# Patient Record
Sex: Male | Born: 1940 | Race: Black or African American | Hispanic: No | Marital: Single | Smoking: Never smoker
Health system: Southern US, Community
[De-identification: ages and names within clinical notes are randomized; demographics above are authoritative.]

## PROBLEM LIST (undated history)

## (undated) DIAGNOSIS — I10 Essential (primary) hypertension: Secondary | ICD-10-CM

## (undated) DIAGNOSIS — E119 Type 2 diabetes mellitus without complications: Secondary | ICD-10-CM

## (undated) DIAGNOSIS — R011 Cardiac murmur, unspecified: Secondary | ICD-10-CM

## (undated) DIAGNOSIS — C61 Malignant neoplasm of prostate: Secondary | ICD-10-CM

## (undated) HISTORY — PX: PROSTATE SURGERY: SHX751

---

## 1998-03-11 ENCOUNTER — Encounter
Admission: RE | Admit: 1998-03-11 | Discharge: 1998-06-09 | Payer: Self-pay | Admitting: Physical Medicine & Rehabilitation

## 1998-07-26 ENCOUNTER — Emergency Department (HOSPITAL_COMMUNITY): Admission: EM | Admit: 1998-07-26 | Discharge: 1998-07-26 | Payer: Self-pay

## 1998-10-04 ENCOUNTER — Encounter: Payer: Self-pay | Admitting: Physical Medicine & Rehabilitation

## 1998-10-04 ENCOUNTER — Ambulatory Visit (HOSPITAL_COMMUNITY)
Admission: RE | Admit: 1998-10-04 | Discharge: 1998-10-04 | Payer: Self-pay | Admitting: Physical Medicine & Rehabilitation

## 1999-10-15 ENCOUNTER — Emergency Department (HOSPITAL_COMMUNITY): Admission: EM | Admit: 1999-10-15 | Discharge: 1999-10-15 | Payer: Self-pay

## 2000-07-17 ENCOUNTER — Inpatient Hospital Stay (HOSPITAL_COMMUNITY): Admission: EM | Admit: 2000-07-17 | Discharge: 2000-07-19 | Payer: Self-pay | Admitting: Emergency Medicine

## 2000-07-17 ENCOUNTER — Encounter: Payer: Self-pay | Admitting: Family Medicine

## 2000-07-18 ENCOUNTER — Encounter: Payer: Self-pay | Admitting: Emergency Medicine

## 2000-12-19 ENCOUNTER — Encounter: Payer: Self-pay | Admitting: Otolaryngology

## 2000-12-19 ENCOUNTER — Encounter: Admission: RE | Admit: 2000-12-19 | Discharge: 2000-12-19 | Payer: Self-pay | Admitting: Otolaryngology

## 2000-12-31 ENCOUNTER — Other Ambulatory Visit: Admission: RE | Admit: 2000-12-31 | Discharge: 2000-12-31 | Payer: Self-pay | Admitting: Otolaryngology

## 2001-02-25 ENCOUNTER — Emergency Department (HOSPITAL_COMMUNITY): Admission: EM | Admit: 2001-02-25 | Discharge: 2001-02-25 | Payer: Self-pay | Admitting: Emergency Medicine

## 2001-07-19 ENCOUNTER — Encounter: Admission: RE | Admit: 2001-07-19 | Discharge: 2001-10-17 | Payer: Self-pay | Admitting: Family Medicine

## 2001-07-30 ENCOUNTER — Encounter: Admission: RE | Admit: 2001-07-30 | Discharge: 2001-07-30 | Payer: Self-pay | Admitting: Otolaryngology

## 2001-07-30 ENCOUNTER — Encounter: Payer: Self-pay | Admitting: Otolaryngology

## 2001-08-30 ENCOUNTER — Other Ambulatory Visit: Admission: RE | Admit: 2001-08-30 | Discharge: 2001-08-30 | Payer: Self-pay | Admitting: Otolaryngology

## 2001-08-30 ENCOUNTER — Encounter (INDEPENDENT_AMBULATORY_CARE_PROVIDER_SITE_OTHER): Payer: Self-pay | Admitting: Specialist

## 2001-12-07 ENCOUNTER — Encounter: Payer: Self-pay | Admitting: Emergency Medicine

## 2001-12-07 ENCOUNTER — Emergency Department (HOSPITAL_COMMUNITY): Admission: EM | Admit: 2001-12-07 | Discharge: 2001-12-07 | Payer: Self-pay | Admitting: Emergency Medicine

## 2003-09-23 ENCOUNTER — Encounter: Payer: Self-pay | Admitting: Emergency Medicine

## 2003-09-23 ENCOUNTER — Inpatient Hospital Stay (HOSPITAL_COMMUNITY): Admission: EM | Admit: 2003-09-23 | Discharge: 2003-09-24 | Payer: Self-pay | Admitting: *Deleted

## 2003-09-24 ENCOUNTER — Encounter: Payer: Self-pay | Admitting: Cardiology

## 2005-05-17 ENCOUNTER — Emergency Department (HOSPITAL_COMMUNITY): Admission: EM | Admit: 2005-05-17 | Discharge: 2005-05-18 | Payer: Self-pay | Admitting: Emergency Medicine

## 2007-02-12 ENCOUNTER — Emergency Department (HOSPITAL_COMMUNITY): Admission: EM | Admit: 2007-02-12 | Discharge: 2007-02-12 | Payer: Self-pay | Admitting: Emergency Medicine

## 2008-04-09 ENCOUNTER — Emergency Department (HOSPITAL_COMMUNITY): Admission: EM | Admit: 2008-04-09 | Discharge: 2008-04-09 | Payer: Self-pay | Admitting: Emergency Medicine

## 2008-05-30 ENCOUNTER — Ambulatory Visit (HOSPITAL_COMMUNITY): Admission: RE | Admit: 2008-05-30 | Discharge: 2008-05-30 | Payer: Self-pay | Admitting: Orthopedic Surgery

## 2008-06-02 ENCOUNTER — Ambulatory Visit (HOSPITAL_BASED_OUTPATIENT_CLINIC_OR_DEPARTMENT_OTHER): Admission: RE | Admit: 2008-06-02 | Discharge: 2008-06-02 | Payer: Self-pay | Admitting: Orthopedic Surgery

## 2008-07-26 ENCOUNTER — Emergency Department (HOSPITAL_COMMUNITY): Admission: EM | Admit: 2008-07-26 | Discharge: 2008-07-26 | Payer: Self-pay | Admitting: Family Medicine

## 2009-12-09 ENCOUNTER — Emergency Department (HOSPITAL_COMMUNITY): Admission: EM | Admit: 2009-12-09 | Discharge: 2009-12-09 | Payer: Self-pay | Admitting: Family Medicine

## 2010-01-05 ENCOUNTER — Emergency Department (HOSPITAL_COMMUNITY): Admission: EM | Admit: 2010-01-05 | Discharge: 2010-01-05 | Payer: Self-pay | Admitting: Emergency Medicine

## 2010-05-02 ENCOUNTER — Observation Stay (HOSPITAL_COMMUNITY): Admission: EM | Admit: 2010-05-02 | Discharge: 2010-05-02 | Payer: Self-pay | Admitting: Emergency Medicine

## 2011-02-27 LAB — URINALYSIS, ROUTINE W REFLEX MICROSCOPIC
Bilirubin Urine: NEGATIVE
Glucose, UA: NEGATIVE mg/dL
Hgb urine dipstick: NEGATIVE
Ketones, ur: NEGATIVE mg/dL
Protein, ur: 30 mg/dL — AB
Urobilinogen, UA: 0.2 mg/dL (ref 0.0–1.0)

## 2011-02-27 LAB — DIFFERENTIAL
Basophils Absolute: 0 10*3/uL (ref 0.0–0.1)
Eosinophils Absolute: 0.2 10*3/uL (ref 0.0–0.7)
Eosinophils Relative: 2 % (ref 0–5)
Lymphocytes Relative: 28 % (ref 12–46)
Lymphs Abs: 2.4 10*3/uL (ref 0.7–4.0)
Neutrophils Relative %: 64 % (ref 43–77)

## 2011-02-27 LAB — POCT I-STAT, CHEM 8
BUN: 13 mg/dL (ref 6–23)
Creatinine, Ser: 1.4 mg/dL (ref 0.4–1.5)
Hemoglobin: 15.6 g/dL (ref 13.0–17.0)
Potassium: 3.5 mEq/L (ref 3.5–5.1)
Sodium: 141 mEq/L (ref 135–145)
TCO2: 27 mmol/L (ref 0–100)

## 2011-02-27 LAB — URINE CULTURE: Colony Count: NO GROWTH

## 2011-02-27 LAB — CBC
HCT: 43.4 % (ref 39.0–52.0)
Platelets: 196 10*3/uL (ref 150–400)
RDW: 14.1 % (ref 11.5–15.5)
WBC: 8.3 10*3/uL (ref 4.0–10.5)

## 2011-03-13 LAB — POCT URINALYSIS DIP (DEVICE)
Bilirubin Urine: NEGATIVE
Glucose, UA: NEGATIVE mg/dL
Ketones, ur: NEGATIVE mg/dL
Nitrite: NEGATIVE

## 2011-04-25 NOTE — Op Note (Signed)
NAMEKEVION, FATHEREE               ACCOUNT NO.:  [id number]   MEDICAL RECORD NO.:  [id number]          PATIENT TYPE:  AMB   LOCATION:  [facility]                         FACILITY:  [facility]   PHYSICIAN:  Deidre Ala, M.D.    DATE OF BIRTH:  06-14-1941   DATE OF PROCEDURE:  06/02/2008  DATE OF DISCHARGE:                               OPERATIVE REPORT   PREOPERATIVE DIAGNOSES:  1. 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  2. Acromioclavicular joint arthritis.  3. Undersurface partial thickness rotator cuff tear by MRI.   POSTOPERATIVE DIAGNOSES:  1. Left shoulder impingement with type 3 acromion.  2. Nonrepairable central rotator cuff tear supraspinatus with      retraction.  3. Slap tear but biceps anchor intact.  4. Osteoarthritis acromioclavicular joint, severe.   PROCEDURE:  1. Left shoulder operative arthroscopy with subacromial arch      decompression and acromioplasty.  2. Extensive debridement of rotator cuff tear nonrepairable and slap      lesion with ablation.  3. Arthroscopic distal clavicle resection.   SURGEON:  Doristine Section, M.D.   ASSISTANT:  Phineas Semen, P.A.-C.   ANESTHESIA:  General with endotracheal with scalene block.   CULTURES:  None.   DRAINS:  None.   BLOOD LOSS:  Minimal.   PATHOLOGY FINDINGS AND HISTORY:  Mr. Mennenga presented to [place] in January  of this year with shoulder pain, sent by Launa Flight, P.A.-C., of [facility].  He had type 3 to 4 acromion with AC  squaring changes and was injected in the subacromial space and the [facility]  joint.  He had impingement signs.  He was first seen on December 16, 2007.  He came back on January 06, 2008, and he had no signs of impingement,  happy with his improvement.  He returned May 29, 2008, and the pain had  returned with a vengeance, and so we sent him for a MRI scan which was  officially read as full width partial thickness bursal surface tearing  of the distal  supraspinatus with retraction of 1-2 cm.  The bursal  surface tearing involves the full width of the tendon and extends into  the superior infraspinatus.  No atrophy of the muscle.  Infraspinatus  showed diffuse tendinopathy with partial thickness bursal surface  tearing.  The teres minor was intact.  The subscapularis showed  insertional tendinopathy.  Long head of the biceps showed tendinopathy  in the rotator interval.  The patient desired to proceed with surgical  intervention at surgery.  We found slap degeneration without frank  detachment.  The biceps tendon had some partial tearing proximally but  overall did not look particularly worn or edematous or injected and  especially when pulled into the joint, so it was left intact.  We just  debrided the slap and used the ablator on 1 to smooth.  There were  fronds of rotator cuff that did not appear normal or well intact to the  tuberosity from underneath.  The glenohumeral surfaces looked good.  The  acromion with sharp hooked with a  prominent CA ligament.  This was all  removed to [facility] margins anterior acromion.  The distal clavicle was  obviously arthritic.  The rotator cuff was pulled centrally off the  tuberosity which was prominent.  I debrided the rotator cuff tear  parameter of the tear about 5 mm because the tissue was not healthy, and  this left me with only about 3-5 mm of intact supraspinatus lateral to  the muscle, and therefore I did not feel he could be pulled through the  footprint and repaired, so I debrided it, smoothing the edges and then  did a tuberosity plasty.  The supraspinatus, infraspinatus was intact.  Good SAD DCR was obtained.   PROCEDURE:  With adequate anesthesia obtained using endotracheal  technique, 1 gram Ancef given IV prophylaxis, the patient was placed in  supine beach-chair position.  Left shoulder was prepped and draped in  the standard fashion.  After standard prepping and draping, skin   markings were made for anatomic positioning.  I then injected the  subacromial space with 20 mL 0.5% Marcaine with epinephrine to open it  up.  I then entered the shoulder through a posterior portal.  Anterior  portal was established just lateral to the coracoid.  I then probed and  debrided with shaver from the front of the slap area and used the  ablator on 1 to smooth.  I pulled the biceps into the joint to check its  consistency and whether not it was inflamed from the groove inward.  I  then reversed portals, and similar shavings were carried out including  the insertion of the rotator cuff as above.  I then entered the  subacromial space in the posterior portal.  Anterolateral portal was  established.  Soft tissue was shaved from the anterior undersurface of  the acromion, and the ablator was used to cauterize.  I then released  the CA ligament with a cautery and used the 6.0 bur to complete  acromioplasty to the roof of the subacromial space in the manner of  Caspari.  A gentle ablation was carried out to smooth the soft tissue  edges.  The the scope was then turned medially sideways, and through the  anterior portal, I debrided the [facility] meniscus and completed the distal  clavicle resection 2 shaver breadths in.  I then evaluated the shoulder  from the lateral portal.  I made an additional posterolateral portal and  pulled the rotator cuff toward me.  It was tight.  The tissues did not  look good quality.  I debrided the rim to what I felt to be healthy, and  it was very avascular and left me with no viable rotator cuff tissue  just lateral to the supraspinatus.  Front and back were then debrided  and smoothed in an arc and ablated so as not to impinge.  I then through  this portal used a bur to complete tuberosity plasty.  Again, ablation  was used to smooth.  The biceps was left intact in its groove.  Good SAD  DCR was seen from the side.  The shoulder was irrigated through the   scope.  Marcaine was not used due to the block.  Portals were closed  with 4-0 nylon.  A bulky sterile compressive dressing was applied with  sling, and the patient having tolerated procedure well was awakened,  taken to recovery room in satisfactory condition, discharged, told to  call the office for recheck tomorrow, given Percocet for pain.  ______________________________  V. Charlesetta Shanks, M.D.     VEP/MEDQ  D:  06/02/2008  T:  06/02/2008  Job:  [id number]   cc:   Deidre Ala, M.D.  Fax: [phone]   Rickard Patience, P.A.  [address]  [place]  [place] [address]

## 2011-04-28 NOTE — Discharge Summary (Signed)
NAMEAYSEN, SHIEH                         ACCOUNT NO.:  [id number]   MEDICAL RECORD NO.:  [id number]                   PATIENT TYPE:  INP   LOCATION:  3728                                 FACILITY:  [facility]   PHYSICIAN:  [name], M.D.                 DATE OF BIRTH:  Apr 25, 1941   DATE OF ADMISSION:  09/23/2003  DATE OF DISCHARGE:  09/24/2003                           DISCHARGE SUMMARY - REFERRING   DISCHARGE DIAGNOSES:  1. Chest pain, resolved.  2. Nonobstructive coronary artery disease.  3. Small perimembranous ventricular septal defect.  4. Hypertension under poor control.  5. Diabetes under poor control.   HOSPITAL COURSE:  Mr. Longest is a 70 year old male patient who has known  coronary artery disease by catheterization two years ago.  At that time, he  had nonobstructive disease in his LAD with a normal right coronary artery  and nonobstructive disease in the circumflex.  There was a question of  ventricular septal defect at that time.  Since that time, he has had poorly  controlled hypertension treated with Catapres and Norvasc.  He has also a  history of Avandia, apparently with poor control.  He has a history of  hyperlipidemia and has recently stopped taking Lipitor.  He presented to his  primary care physician complaining of exertional dyspnea and discomfort at  rest.  He was referred to the emergency room where he was admitted by  [facility].   Lab studies during patient's hospital stay reveal normal cardiac isoenzymes.  The patient's creatinine was transiently elevated at 1.9 was transiently  elevated at 1.9, and he was hydrated.  Potassium remained normal at 3.6, BUN  15.  White count 5.6, hemoglobin 13.5, hematocrit 38.7, platelets 227.   EKG reveals normal sinus rhythm, rate 61, with no acute ST-T wave changes.   Portable chest x-ray revealed nodular opacity at the cardiac apex, and  recommendations were to follow up with a two-view chest x-ray  to exclude an  underlying mass lesion.   The patient then underwent a cardiac catheterization on September 24, 2003.  He was found to have a 50% proximal RCA lesion followed by a 40% LAD lesion.  Diagonal #1 had sequential 30 and 20% lesions.  Circumflex was  angiographically normal.   At this point, Dr. Samule Ohm thought he had moderate nonobstructive coronary  artery disease and suspected noncardiac chest pain.  He felt there was a  small ventricular septal defect, and a 2-D echocardiogram confirmed a small  perimembranous ventricular septal defect with probable normal RV systolic  pressure.  There were no wall motion  abnormalities a normal EF of 55 to 60% .  At this point, we need to make  sure that the patient's chest x-ray is followed up, and I will do this today  prior to discharge home.  If this is normal, will left him proceed with  discharge home.  If not, we can go ahead and arrange outpatient CT scanning.      Guy Franco, P.A. [facility]                      [name], M.D.    LB/MEDQ  D:  09/24/2003  T:  09/24/2003  Job:  [id number]   cc:   [name]. [name], M.D.  [address]  [place]  [place] [address]  Fax: [phone]   [name], M.D.

## 2011-04-28 NOTE — Cardiovascular Report (Signed)
NAMELOGHAN, KURTZMAN                         ACCOUNT NO.:  [id number]   MEDICAL RECORD NO.:  [id number]                   PATIENT TYPE:  INP   LOCATION:  3728                                 FACILITY:  [facility]   PHYSICIAN:  Salvadore Farber, M.D.             DATE OF BIRTH:  28-Oct-1941   DATE OF PROCEDURE:  09/24/2003  DATE OF DISCHARGE:                              CARDIAC CATHETERIZATION   PROCEDURE:  Right and left heart catheterization, coronary angiography.   INDICATION:  Mr. Headen is a 70 year old gentleman with a known congenital  ventricular septal defect and moderate, nonobstructive coronary disease on  catheterization performed August 2001.  He now presents with several weeks  of chest discomfort occurring primarily at rest.  He has had no chest  discomfort with exertion.  He was admitted to hospital and ruled out for  myocardial infarction by serial electrocardiograms and enzymes.  Given his  syndrome concerning for unstable angina in the setting of known coronary  artery disease, he was referred directly for coronary angiography.   PROCEDURAL TECHNIQUE:  Informed consent was obtained.  Under 1% lidocaine  local anesthesia, a 6 French sheath was placed in the right femoral and an 8  French sheath in the right femoral vein using the modified Seldinger  technique.  Right heart catheterization was performed using a balloon-tipped  catheter.  Coronary angiography was performed using JL-4 and JR-4 catheters.  Left heart catheterization was performed using a pigtail catheter.  Left  ventriculography was not performed due to the patient's renal insufficiency  in the setting of diabetes mellitus.  The patient tolerated the procedure  well and was transferred to the holding room in stable condition.  Sheaths  are to be removed there.   COMPLICATIONS:  None.   FINDINGS:   HEMODYNAMICS:  1. RA 7, oxygen saturation 72%; RV 36/6/9, oxygen saturation 73%; PA     36/13/23,  oxygen saturation 72%, PCW 13/13/10; LV 184/3/14, aorta     184/96/133, oxygen saturation 96%, SVC oxygen saturation 70%, IVC oxygen     saturation 65%.  2. Cardiac output/index (Fick) 5.1/2.7, (thermodilution) 8.2/4.4.  3. No aortic stenosis on pullback.  4. No mitral stenosis.  5. QP/QS 1.14.   CORONARY ANGIOGRAPHY:  1. Left main:  Angiographically normal.  2. LAD:  The LAD is a moderate size vessel giving rise to a large first and     small diagonal branch.  The mid LAD has long 40% stenosis arising after     the take off of the first diagonal.  The first diagonal has an ostial 30%     stenosis and a 20% stenosis of the mid vessel.  3. Circumflex:  Luminal irregularities.  4. RCA:  Large, dominant vessel.  There is a focal 50% stenosis of the mid     vessel. The remainder of the vessel was without significant disease.    IMPRESSION/RECOMMENDATIONS:  1. Moderate nonobstructive coronary disease.  Suspect noncardiac etiology to     his chest discomfort.  2. Small ventricular septal defect with QP/QS 1.14.  This is a trivial left-     to-right shunt.                                               Salvadore Farber, M.D.    WED/MEDQ  D:  09/24/2003  T:  09/24/2003  Job:  [id number]   cc:   [name]. [name], M.D.  [address]  [place]  [place] [address]  Fax: [phone]   [name], M.D.

## 2011-04-28 NOTE — H&P (Signed)
Mathew Garcia, Mathew Garcia                         ACCOUNT NO.:  [id number]   MEDICAL RECORD NO.:  [id number]                   PATIENT TYPE:  INP   LOCATION:  1827                                 FACILITY:  [facility]   PHYSICIAN:  Vida Roller, M.D.                DATE OF BIRTH:  [birth year]   DATE OF ADMISSION:  09/23/2003  DATE OF DISCHARGE:                                HISTORY & PHYSICAL   HISTORY OF PRESENT ILLNESS:  Mr. Cicero is a 70 year old African-American  man with known coronary artery disease status post catheterization 2 years  ago for atypical chest discomfort. At that time, he had a perfusion study  which showed evidence of an inferior wall ischemia. He was cathed. Had non-  obstructive disease in his LAD and circumflex with normal right coronary  artery, normal LV function but a question of ventricular septal defect. He  has diabetes mellitus, which has recently been poorly controlled only on  Avandia; hypertension, which has recently been poorly controlled on Catapres  and Norvasc. He has also hyperlipidemia. He has recently stopped taking his  Lipitor. He has a history of VSD. 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 However, it appears that he is followed by Dr.  Tenny Craw but has not been seen by her in a number of years.  He comes in today  complaining of discomfort in his chest over about the last 2 weeks. He  presented to see his primary care physician complaining of exertional  discomfort with some discomfort at rest. He has no shortness of breath or  palpitations. No PND or orthopnea. He was referred to the [facility] emergency room where he was seen.   PAST MEDICAL HISTORY:  Significant for the evaluation for chest discomfort  with normal left ventricular function. He also has diabetes, hypertension  and the history of the VSD. Also has hyperlipidemia.   ALLERGIES:  CODEINE.   SOCIAL HISTORY:  He is married. He lives in [place]. He has 7 children,  all of whom are grown.   FAMILY HISTORY:  Significant for coronary artery disease in his mother.   REVIEW OF SYSTEMS:  Generally noncontributory.   PHYSICAL EXAMINATION:  GENERAL: A well developed, well nourished African-  American man in no apparent distress who is alert and oriented x4.  VITAL SIGNS: Heart rate is 88. Blood pressure is 150/80. Respiratory rate 14  and he is afebrile. He is not complaining of any chest pain.  HEENT: Unremarkable.  NECK: Supple. There is no jugular venous distention. No carotid bruits.  CHEST:  Clear to auscultation.  CARDIAC: Examination reveals a non-displaced point of maximal impulse with  no lifts or thrills. First and second heart sounds are normal. There is a  loud, harsh sounding 3/6 systolic murmur,  heard best at the upper left  sternal border, which does not radiate. There is no diastolic murmur.  ABDOMEN: Soft, nontender, normal active bowel sounds.  EXTREMITIES: Lower extremities have no edema. No cyanosis. He has 2+ pulses  throughout.   LABORATORY DATA:  Electrocardiogram reveals sinus rhythm with marked sinus  arrhythmia. He has left axis deviation with non-specific STT wave changes.   His laboratories are all pending.   Chest x-ray is also pending.   ASSESSMENT:  This is a gentleman with concerning chest discomfort, known  coronary artery disease, multiple cardiac risk facts which have been poorly  controlled. I think it is probably reasonable to cycle his enzymes, admit  him to the hospital, start him on a beta blocker, Lovenox, aspirin, and an  ace inhibitor to control his blood pressure. Will get an echocardiogram  today to assess the severity of his ventricular septal defect and I think  that he probably needs a right and left heart catheterization and we will  put him on the schedule for tomorrow to do that. Will also contact  Dr. Tenny Craw,  to let her know that we are planning to do this for the patient.                                                Vida Roller, M.D.    JH/MEDQ  D:  09/23/2003  T:  09/23/2003  Job:  [id number]   cc:   [name]. [name], M.D.  [address]  [place]  [place] [address]  Fax: [phone]   [name], M.D.

## 2011-04-28 NOTE — Cardiovascular Report (Signed)
[place]. [facility]  Patient:    Mathew Garcia, Mathew Garcia                      MRN: [id number] Proc. Date: 07/19/00 Adm. Date:  [id number] Attending:  [name] CC:         [name]. [name], M.D.  Dietrich Pates, M.D. [facility]   Cardiac Catheterization  PROCEDURE PERFORMED:  Left heart catheterization with coronary angiography, left ventriculography and abdominal aortography.  INDICATION:  Mr. Gosselin is a 70 year old male with a history of a small ventriculoseptal defect.  He was admitted with chest pain and hypertension.  A stress Cardiolite was interpreted as revealing distal inferior ischemia.  He was referred for cardiac catheterization.  PROCEDURAL NOTE:  A 6-French sheath was placed in the right femoral artery. Standard Judkins 6-French catheters were utilized.  Contrast was Omnipaque. At the conclusion of the procedure, a Perclose vascular closure device was placed in the right femoral artery with good hemostasis.  There were no complications.  CATHETERIZATION RESULTS Hemodynamics:  Left ventricular pressure 194/22; aortic pressure 194/104. There was no aortic valve gradient.  Left ventriculogram:  Wall motion is normal.  Ejection fraction calculated at 67%.  No mitral regurgitation.  There is contrast seen in the right ventricle during left ventriculography, consistent with a small ventriculoseptal defect.  Abdominal aortogram:  Abdominal aortogram reveals normal renal arteries, abdominal aorta and iliac arteries.  Coronary arteriography (right dominant): 1. Left main normal. 2. Left anterior descending artery has a 50% stenosis in the mid-vessel just    after a large first diagonal branch.  The first diagonal itself has a 30%    stenosis at its origin.  There is a small second diagonal. 3. Left circumflex has a 40% stenosis at its origin.  The circumflex gives    rise to a large single obtuse marginal branch. 4. Right  coronary artery is a dominant vessel.  It gives rise to a normal-size    posterior descending artery, a small first posterolateral branch and a    large second posterolateral branch.  The right coronary artery is free of    angiographic disease.  IMPRESSION 1. Significant systemic hypertension. 2. Normal left ventricular systolic function with what appears to be a small    ventriculoseptal defect. 3. Nonobstructive coronary artery disease. DD:  07/19/00 TD:  07/19/00 Job: [id number]

## 2011-04-28 NOTE — Discharge Summary (Signed)
[place]. [facility]  Patient:    Mathew Garcia, Mathew Garcia                      MRN: [id number] Adm. Date:  [id number] Disc. Date: 07/19/00 Attending:  Pricilla Garcia Dictator:   Mathew Garcia, P.A.-C. CC:         Mathew Garcia. Mathew Garcia, M.D.   Discharge Summary  DATE OF BIRTH:  1941/03/25  DISCHARGE DIAGNOSES: 1. Nonobstructive coronary artery disease. 2. Uncontrolled hypertension. 3. Family history of coronary artery disease. 4. Small ventricular septal defect.  PROCEDURES PERFORMED: 1. Adenosine Cardiolite revealing mild distal inferior ischemia. 2. Cardiac catheterization on July 19, 2000, revealing normal ventricle    with normal wall motion, ejection fraction calculated at 67%.  No    mitral regurgitation, positive VSD.  Left main normal.  LAD with    midstenosis of 50%.  First diagonal large with 30% stenosis.  Left    circumflex with ostial stenosis of 40%.  RCA normal.  HISTORY OF PRESENT ILLNESS:  This 70 year old African-Americanerican male with a history of chest pain with a negative Cardiolite in our office last year, presented to the emergency room for evaluation of chest pain.  His chest pain occurred while mowing his lawn.  He developed left chest pain that radiated to his left arm described as a sharp shooting fleeting pain.  It was present off and on.  He decided to go to the emergency room.  There he was found to have a blood pressure of 196/103.  His electrocardiogram showed sinus bradycardia with inferolateral T-wave inversions.  He denied any chest pressure, heaviness, shortness of breath, palpitations, dizziness, or presyncope.  He noted a history of a catheterization in his 70s for congestive heart failure in [place], [place].  PHYSICAL EXAMINATION:  GENERAL:  A pleasant 70 year old black male.  VITAL SIGNS:  Blood pressure 196/103, pulse 50, respirations 16.  NECK:  Without jugular venous distention or bruits.  LUNGS:  Clear  with crackles at the bases.  HEART:  A regular rate and rhythm.  A 2/6 systolic murmur at the apex.  PMI normal.  No right ventricular heave.  ABDOMEN:  Soft.  EXTREMITIES:  Without edema.  Initial chest x-ray revealed no acute cardiopulmonary process.  LABORATORY DATA:  Initial labs showed a WBC of 7300, hemoglobin 13, hematocrit 39, platelet count 228,000.  INR 1.1.  Sodium 142, potassium 3.3, chloride 103, glucose 132, BUN 18, creatinine 1.5.  Total bilirubin 0.8, alkaline phosphatase 93, SGOT 17, SGPT 12, total protein 6.7, albumin 3.2.  Cardiac enzymes:  Total CPK #1 of 125, #2 of 114.  CPK-MB #1 of 1.1, #2 of 1.0. Troponin I #1 of 0.03, #2 of less than 0.03.  Lipid profile:  Total cholesterol 176, triglycerides 131, HDL 32, LDL 118.  HOSPITAL COURSE:  The patient was admitted for chest pain and ruled out for a myocardial infarction by enzymes, as noted above.  Norvasc was added for blood pressure control.  It was decided that he should go for an adenosine Cardiolite to rule out ischemia.  The results of this are noted above.  Given the results of mild distal inferior ischemia, it was decided that the patient should go for a cardiac catheterization.  He went for this on July 19, 2000. The results are noted above.  He had no immediate complications.  Given his nonobstructive coronary artery disease, it was felt he was stable enough for discharge  to home.  The positive VSD by cardiac catheterization was noted to be the culprit of his murmur.  DISCHARGE MEDICATIONS: 1. Catapres 0.2 mg b.i.d. 2. Coated aspirin 81 mg q.d. 3. Norvasc 10 mg p.o. q.d.  ACTIVITIES:  No lifting, driving, sexual activity, or exertion for two days.  DIET:  Low-salt diet, low-fat, low-cholesterol diet.  WOUND CARE:  The patient is to watch his groin for bruising, swelling, drainage, or discomfort, and to call our office with concerns.  FOLLOWUP:  With Dr. Elyse Garcia physicians assistant on Monday,  July 30, 2000, at 8:30 a.m.  The patient should follow up with his primary care physician, Dr. Duffy Rhody C. Garcia next week for a blood pressure check.  He should call for this appointment.  ADDENDUM:  The patients potassium was repleted, and it was noted to be 3.5 prior to discharge. DD:  07/19/00 TD:  07/19/00 Job: [id number]

## 2011-09-05 LAB — URINALYSIS, ROUTINE W REFLEX MICROSCOPIC
Leukocytes, UA: NEGATIVE
Nitrite: NEGATIVE
Specific Gravity, Urine: 1.013
Urobilinogen, UA: 1
pH: 6

## 2011-09-05 LAB — URINE MICROSCOPIC-ADD ON

## 2011-09-07 LAB — POCT I-STAT 4, (NA,K, GLUC, HGB,HCT)
HCT: 44
Hemoglobin: 15
Sodium: 141

## 2013-01-31 ENCOUNTER — Emergency Department (HOSPITAL_COMMUNITY): Payer: Medicare PPO

## 2013-01-31 ENCOUNTER — Encounter (HOSPITAL_COMMUNITY): Payer: Self-pay | Admitting: Emergency Medicine

## 2013-01-31 ENCOUNTER — Inpatient Hospital Stay (HOSPITAL_COMMUNITY)
Admission: EM | Admit: 2013-01-31 | Discharge: 2013-02-04 | DRG: 300 | Disposition: A | Discharge status: Home / Self Care | Payer: Medicare PPO | Attending: Internal Medicine | Admitting: Internal Medicine

## 2013-01-31 DIAGNOSIS — E119 Type 2 diabetes mellitus without complications: Secondary | ICD-10-CM | POA: Diagnosis present

## 2013-01-31 DIAGNOSIS — Z7982 Long term (current) use of aspirin: Secondary | ICD-10-CM

## 2013-01-31 DIAGNOSIS — Z8546 Personal history of malignant neoplasm of prostate: Secondary | ICD-10-CM

## 2013-01-31 DIAGNOSIS — R011 Cardiac murmur, unspecified: Secondary | ICD-10-CM

## 2013-01-31 DIAGNOSIS — Z79899 Other long term (current) drug therapy: Secondary | ICD-10-CM

## 2013-01-31 DIAGNOSIS — Q210 Ventricular septal defect: Secondary | ICD-10-CM

## 2013-01-31 DIAGNOSIS — I129 Hypertensive chronic kidney disease with stage 1 through stage 4 chronic kidney disease, or unspecified chronic kidney disease: Secondary | ICD-10-CM | POA: Diagnosis present

## 2013-01-31 DIAGNOSIS — I7774 Dissection of vertebral artery: Secondary | ICD-10-CM

## 2013-01-31 DIAGNOSIS — E785 Hyperlipidemia, unspecified: Secondary | ICD-10-CM | POA: Diagnosis present

## 2013-01-31 DIAGNOSIS — N183 Chronic kidney disease, stage 3 unspecified: Secondary | ICD-10-CM | POA: Diagnosis present

## 2013-01-31 DIAGNOSIS — I10 Essential (primary) hypertension: Secondary | ICD-10-CM | POA: Diagnosis present

## 2013-01-31 DIAGNOSIS — N289 Disorder of kidney and ureter, unspecified: Secondary | ICD-10-CM | POA: Diagnosis present

## 2013-01-31 DIAGNOSIS — E876 Hypokalemia: Secondary | ICD-10-CM | POA: Diagnosis present

## 2013-01-31 DIAGNOSIS — I059 Rheumatic mitral valve disease, unspecified: Secondary | ICD-10-CM

## 2013-01-31 DIAGNOSIS — I2510 Atherosclerotic heart disease of native coronary artery without angina pectoris: Secondary | ICD-10-CM | POA: Diagnosis present

## 2013-01-31 DIAGNOSIS — R918 Other nonspecific abnormal finding of lung field: Secondary | ICD-10-CM | POA: Diagnosis present

## 2013-01-31 HISTORY — DX: Type 2 diabetes mellitus without complications: E11.9

## 2013-01-31 HISTORY — DX: Malignant neoplasm of prostate: C61

## 2013-01-31 HISTORY — DX: Essential (primary) hypertension: I10

## 2013-01-31 HISTORY — DX: Cardiac murmur, unspecified: R01.1

## 2013-01-31 LAB — CBC WITH DIFFERENTIAL/PLATELET
Basophils Relative: 0 % (ref 0–1)
Hemoglobin: 13.9 g/dL (ref 13.0–17.0)
Lymphocytes Relative: 45 % (ref 12–46)
MCHC: 35.9 g/dL (ref 30.0–36.0)
Monocytes Relative: 7 % (ref 3–12)
Neutro Abs: 2.9 10*3/uL (ref 1.7–7.7)
Neutrophils Relative %: 43 % (ref 43–77)
RBC: 4.48 MIL/uL (ref 4.22–5.81)
WBC: 6.8 10*3/uL (ref 4.0–10.5)

## 2013-01-31 LAB — URINALYSIS, ROUTINE W REFLEX MICROSCOPIC
Glucose, UA: 100 mg/dL — AB
Ketones, ur: NEGATIVE mg/dL
Protein, ur: 100 mg/dL — AB

## 2013-01-31 LAB — COMPREHENSIVE METABOLIC PANEL
AST: 24 U/L (ref 0–37)
Albumin: 3.8 g/dL (ref 3.5–5.2)
Alkaline Phosphatase: 122 U/L — ABNORMAL HIGH (ref 39–117)
BUN: 19 mg/dL (ref 6–23)
CO2: 24 mEq/L (ref 19–32)
Chloride: 102 mEq/L (ref 96–112)
GFR calc non Af Amer: 35 mL/min — ABNORMAL LOW (ref >90)
Potassium: 3.3 mEq/L — ABNORMAL LOW (ref 3.5–5.1)
Total Bilirubin: 0.5 mg/dL (ref 0.3–1.2)

## 2013-01-31 LAB — GLUCOSE, CAPILLARY: Glucose-Capillary: 170 mg/dL — ABNORMAL HIGH (ref 70–99)

## 2013-01-31 LAB — HEPARIN LEVEL (UNFRACTIONATED)
Heparin Unfractionated: <0.1 IU/mL — ABNORMAL LOW (ref 0.30–0.70)
Heparin Unfractionated: 0.5 IU/mL (ref 0.30–0.70)

## 2013-01-31 MED ORDER — ONDANSETRON HCL 4 MG PO TABS: 4.0000 mg | ORAL_TABLET | Freq: Four times a day (QID) | ORAL | Status: DC | PRN

## 2013-01-31 MED ORDER — DOCUSATE SODIUM 100 MG PO CAPS
100.0000 mg | ORAL_CAPSULE | Freq: Two times a day (BID) | ORAL | Status: DC
  Administered 2013-01-31 – 2013-02-04 (×6): 100 mg via ORAL
  Filled 2013-01-31 (×10): qty 1

## 2013-01-31 MED ORDER — DEXTROSE-NACL 5-0.9 % IV SOLN
INTRAVENOUS | Status: DC
  Administered 2013-01-31 – 2013-02-01 (×2): via INTRAVENOUS

## 2013-01-31 MED ORDER — LABETALOL HCL 5 MG/ML IV SOLN: 10.0000 mg | INTRAVENOUS | Status: DC | PRN

## 2013-01-31 MED ORDER — IOHEXOL 350 MG/ML SOLN
80.0000 mL | Freq: Once | INTRAVENOUS | Status: AC | PRN
  Administered 2013-01-31: 80 mL via INTRAVENOUS

## 2013-01-31 MED ORDER — HEPARIN (PORCINE) IN NACL 100-0.45 UNIT/ML-% IJ SOLN
900.0000 [IU]/h | INTRAMUSCULAR | Status: DC
  Administered 2013-01-31 – 2013-02-02 (×3): 900 [IU]/h via INTRAVENOUS
  Filled 2013-01-31 (×5): qty 250

## 2013-01-31 MED ORDER — SIMVASTATIN 20 MG PO TABS
20.0000 mg | ORAL_TABLET | Freq: Every evening | ORAL | Status: DC
  Administered 2013-02-01 – 2013-02-03 (×3): 20 mg via ORAL
  Filled 2013-01-31 (×5): qty 1

## 2013-01-31 MED ORDER — HYDRALAZINE HCL 20 MG/ML IJ SOLN
5.0000 mg | INTRAMUSCULAR | Status: DC | PRN
  Administered 2013-01-31: 5 mg via INTRAVENOUS
  Filled 2013-01-31: qty 1

## 2013-01-31 MED ORDER — WARFARIN - PHARMACIST DOSING INPATIENT: Freq: Every day | Status: DC

## 2013-01-31 MED ORDER — SODIUM CHLORIDE 0.9 % IV BOLUS (SEPSIS)
1000.0000 mL | Freq: Once | INTRAVENOUS | Status: AC
  Administered 2013-01-31: 1000 mL via INTRAVENOUS

## 2013-01-31 MED ORDER — ONDANSETRON HCL 4 MG/2ML IJ SOLN
4.0000 mg | Freq: Once | INTRAMUSCULAR | Status: AC
  Administered 2013-01-31: 4 mg via INTRAVENOUS
  Filled 2013-01-31: qty 2

## 2013-01-31 MED ORDER — HYDROMORPHONE HCL PF 1 MG/ML IJ SOLN
1.0000 mg | Freq: Once | INTRAMUSCULAR | Status: AC
  Administered 2013-01-31: 1 mg via INTRAVENOUS
  Filled 2013-01-31: qty 1

## 2013-01-31 MED ORDER — AMLODIPINE BESYLATE 10 MG PO TABS
10.0000 mg | ORAL_TABLET | Freq: Every evening | ORAL | Status: DC
  Administered 2013-01-31 – 2013-02-03 (×4): 10 mg via ORAL
  Filled 2013-01-31 (×7): qty 1

## 2013-01-31 MED ORDER — ONDANSETRON HCL 4 MG/2ML IJ SOLN
4.0000 mg | Freq: Four times a day (QID) | INTRAMUSCULAR | Status: DC | PRN
  Administered 2013-01-31 – 2013-02-01 (×2): 4 mg via INTRAVENOUS
  Filled 2013-01-31 (×2): qty 2

## 2013-01-31 MED ORDER — LABETALOL HCL 5 MG/ML IV SOLN: 5.0000 mg | INTRAVENOUS | Status: DC | PRN

## 2013-01-31 MED ORDER — METOPROLOL TARTRATE 50 MG PO TABS
50.0000 mg | ORAL_TABLET | Freq: Two times a day (BID) | ORAL | Status: DC
  Administered 2013-01-31: 50 mg via ORAL
  Filled 2013-01-31 (×2): qty 1

## 2013-01-31 MED ORDER — METOPROLOL TARTRATE 100 MG PO TABS
100.0000 mg | ORAL_TABLET | Freq: Two times a day (BID) | ORAL | Status: DC
  Administered 2013-01-31 – 2013-02-02 (×2): 100 mg via ORAL
  Filled 2013-01-31 (×5): qty 1

## 2013-01-31 MED ORDER — HYDROMORPHONE HCL PF 1 MG/ML IJ SOLN
1.0000 mg | INTRAMUSCULAR | Status: DC | PRN
  Administered 2013-01-31: 1 mg via INTRAVENOUS
  Administered 2013-01-31: 0.5 mg via INTRAVENOUS
  Administered 2013-02-01: 1 mg via INTRAVENOUS
  Filled 2013-01-31 (×3): qty 1

## 2013-01-31 MED ORDER — WARFARIN SODIUM 5 MG PO TABS: 5.0000 mg | ORAL_TABLET | Freq: Every day | ORAL | Status: DC

## 2013-01-31 MED ORDER — SODIUM CHLORIDE 0.9 % IJ SOLN
3.0000 mL | Freq: Two times a day (BID) | INTRAMUSCULAR | Status: DC
  Administered 2013-01-31 – 2013-02-04 (×6): 3 mL via INTRAVENOUS

## 2013-01-31 MED ORDER — ASPIRIN EC 81 MG PO TBEC
81.0000 mg | DELAYED_RELEASE_TABLET | Freq: Every morning | ORAL | Status: DC
  Administered 2013-01-31 – 2013-02-03 (×4): 81 mg via ORAL
  Filled 2013-01-31 (×6): qty 1

## 2013-01-31 MED ORDER — HYDROMORPHONE HCL PF 1 MG/ML IJ SOLN
0.5000 mg | Freq: Once | INTRAMUSCULAR | Status: AC
  Administered 2013-01-31: 0.5 mg via INTRAVENOUS
  Filled 2013-01-31: qty 1

## 2013-01-31 MED ORDER — INSULIN ASPART 100 UNIT/ML ~~LOC~~ SOLN
0.0000 [IU] | SUBCUTANEOUS | Status: DC
  Administered 2013-01-31 – 2013-02-01 (×4): 3 [IU] via SUBCUTANEOUS
  Administered 2013-02-01 – 2013-02-02 (×2): 2 [IU] via SUBCUTANEOUS
  Filled 2013-01-31: qty 1

## 2013-01-31 MED ORDER — GLIPIZIDE 10 MG PO TABS
10.0000 mg | ORAL_TABLET | Freq: Two times a day (BID) | ORAL | Status: DC
  Administered 2013-01-31 – 2013-02-04 (×9): 10 mg via ORAL
  Filled 2013-01-31 (×14): qty 1

## 2013-01-31 MED ORDER — WARFARIN SODIUM 7.5 MG PO TABS
7.5000 mg | ORAL_TABLET | Freq: Once | ORAL | Status: AC
  Administered 2013-01-31: 7.5 mg via ORAL
  Filled 2013-01-31: qty 1

## 2013-01-31 NOTE — Progress Notes (Signed)
  Echocardiogram 2D Echocardiogram has been performed.  Mathew Garcia 01/31/2013, 11:03 AM

## 2013-01-31 NOTE — ED Notes (Signed)
PA at bedside.

## 2013-01-31 NOTE — ED Notes (Signed)
[id number]<AV> Expected date:<BR> Expected time:<BR> Means of arrival:<BR> Comments:<BR> EMS/hypertension and CBG 166-c/o head and neck pain/73 yo male

## 2013-01-31 NOTE — Consult Note (Signed)
[facility] Consult Note  Reason for Consult: right vertebral artery dissection Referring Physician: Dr. Rulon Abide  CC: Right neck pain  HPI: Mathew Garcia is an 72 y.o. male with a PMH significant for diabetes, hypertension, and prostate cancer s/p radical prostatectomy presented to the [facility] complaining of severe right neck pain.  The pain was a sharp throbbing pain that ran from the base of the neck to the right ear.  He states that it was present when he woke up 2/20 AM and that he was unable to move his head because of the pain.  He denies any slurred speech, facial droop, weakness, numbness, tingling, double vision, loss of vision, or unsteadiness on his feet.  He does note that today his blood pressure was elevated despite the fact that he took his usual blood pressure medications.  CT scan in the ED showed a short segment thrombosis of the right vertebral artery with reconstituted flow.    Past Medical History  Diagnosis Date  . Diabetes mellitus without complication   . Heart murmur   . Hypertension   . Prostate cancer    Past Surgical History  Procedure Laterality Date  . Prostate surgery     Family History  Problem Relation Age of Onset  . Diabetes Mother   . Hypertension Mother    Social History:  reports that he has never smoked. He has never used smokeless tobacco. He reports that he does not drink alcohol or use illicit drugs.  No Known Allergies  Medications: I have reviewed the patient's current medications. Medications Prior to Admission  Medication Sig Dispense Refill  . amLODipine (NORVASC) 10 MG tablet Take 10 mg by mouth every evening.      Marland Kitchen aspirin EC 81 MG tablet Take 81 mg by mouth every morning.      Marland Kitchen glipiZIDE (GLUCOTROL) 10 MG tablet Take 10 mg by mouth 2 (two) times daily before a meal.      . metoprolol (LOPRESSOR) 50 MG tablet Take 50 mg by mouth 2 (two) times daily.      . simvastatin (ZOCOR) 20 MG tablet Take 20 mg by mouth every evening.        ROS: Constitutional: Denies fever, chills, diaphoresis, appetite change and fatigue.  HEENT: Positive for neck pain.  Denies photophobia, eye pain, redness, hearing loss, ear pain, congestion, sore throat, rhinorrhea, sneezing, mouth sores, trouble swallowing, neck stiffness and tinnitus.   Respiratory: Denies SOB, DOE, cough, chest tightness,  and wheezing.   Cardiovascular: Denies chest pain, palpitations and leg swelling.  Gastrointestinal: Denies nausea, vomiting, abdominal pain, diarrhea, constipation, blood in stool and abdominal distention.  Genitourinary: Denies dysuria, urgency, frequency, hematuria, flank pain and difficulty urinating.  Musculoskeletal: Denies myalgias, back pain, joint swelling, arthralgias and gait problem.  Skin: Denies pallor, rash and wound.  Neurological: Denies dizziness, seizures, syncope, weakness, light-headedness, numbness and headaches.  Hematological: Denies adenopathy. Easy bruising, personal or family bleeding history  Psychiatric/Behavioral: Denies suicidal ideation, mood changes, confusion, nervousness, sleep disturbance and agitation  Physical Examination: Blood pressure 187/90, pulse 86, temperature 98.8 F (37.1 C), temperature source Oral, resp. rate 13, height 5\' 5"  (1.651 m), weight 181 lb (82.1 kg), SpO2 98.00%. Constitutional: Vital signs reviewed.  Patient is a well-developed and well-nourished man in no acute distress and cooperative with exam. Alert and oriented x3.  Head: Normocephalic and atraumatic Ear: TM normal bilaterally Mouth: no erythema or exudates, MMM Eyes: PERRL, EOMI, conjunctivae normal, No scleral icterus.  Neck:  Supple, Trachea midline normal ROM, No JVD, mass, thyromegaly, or carotid bruit present.  Cardiovascular: RRR, occasional PVC noted on the monitor. 2/6 systolic murmur best heard in the left lower sternal border, mildly accented with valsalva maneuver. S1 normal, S2 normal, no Rubs or gallops, pulses  symmetric and intact bilaterally Pulmonary/Chest: CTAB, no wheezes, rales, or rhonchi Abdominal: Soft. Non-tender, non-distended, bowel sounds are normal, no masses, organomegaly, or guarding present.  GU: no CVA tenderness Musculoskeletal: No joint deformities, erythema, or stiffness, ROM full and no nontender Hematology: no cervical, inginal, or axillary adenopathy.  Skin: Warm, dry and intact. No rash, cyanosis, or clubbing.  Psychiatric: Normal mood and affect. speech and behavior is normal. Judgment and thought content normal. Cognition and memory are normal.   Neurologic Examination Mental Status: Alert, oriented, thought content appropriate.  Speech fluent without evidence of aphasia.  Able to follow 3 step commands without difficulty. Cranial Nerves: II: visual fields grossly normal, pupils equal, round, reactive to light and accommodation III,IV, VI: ptosis not present, extra-ocular motions intact bilaterally V,VII: smile symmetric, facial light touch sensation normal bilaterally VIII: hearing normal bilaterally IX,X: gag reflex present XI: trapezius strength/neck flexion strength normal bilaterally XII: tongue strength normal  Motor: Right : Upper extremity    Left:     Upper extremity 5/5 deltoid       5/5 deltoid 5/5 biceps      5/5 biceps  5/5 triceps      5/5 triceps 5/5wrist flexion     5/5 wrist flexion 5/5 wrist extension     5/5 wrist extension 5/5 hand grip      5/5 hand grip  Lower extremity     Lower extremity 5/5 hip flexor      5/5 hip flexor 5/5 hip adductors     5/5 hip adductors 5/5 hip abductors     5/5 hip abductors 5/5 quadricep      5/5 quadriceps  5/5 hamstrings     5/5 hamstrings 5/5 plantar flexion       5/5 plantar flexion 5/5 plantar extension     5/5 plantar extension Tone and bulk:normal tone throughout; no atrophy noted Sensory: Pinprick and light touch intact throughout, bilaterally Deep Tendon Reflexes: 2+ and symmetric in the  brachioradialis, patellar, and ankle.  Plantars: Right: downgoing   Left: downgoing Cerebellar: normal finger-to-nose, normal rapid alternating movements and normal heel-to-shin test Gait not tested.   Laboratory Studies:   Basic Metabolic Panel:  Recent Labs Lab 01/31/13 0130  NA 140  K 3.3*  CL 102  CO2 24  GLUCOSE 205*  BUN 19  CREATININE 1.88*  CALCIUM 9.0   Liver Function Tests:  Recent Labs Lab 01/31/13 0130  AST 24  ALT 15  ALKPHOS 122*  BILITOT 0.5  PROT 8.6*  ALBUMIN 3.8   CBC:  Recent Labs Lab 01/31/13 0130  WBC 6.8  NEUTROABS 2.9  HGB 13.9  HCT 38.7*  MCV 86.4  PLT 208   CBG:  Recent Labs Lab 01/31/13 0557  GLUCAP 170*    Microbiology: Results for orders placed during the hospital encounter of 05/02/10  URINE CULTURE     Status: None   Collection Time    05/02/10 12:56 PM      Result Value Range Status   Specimen Description URINE, CLEAN CATCH   Final   Special Requests NONE   Final   Colony Count NO GROWTH   Final   Culture NO GROWTH   Final  Report Status 05/03/2010 FINAL   Final   Urinalysis:  Recent Labs Lab 01/31/13 0320  COLORURINE YELLOW  LABSPEC 1.013  PHURINE 6.5  GLUCOSEU 100*  HGBUR MODERATE*  BILIRUBINUR NEGATIVE  KETONESUR NEGATIVE  PROTEINUR 100*  UROBILINOGEN 0.2  NITRITE NEGATIVE  LEUKOCYTESUR NEGATIVE   Other results: EKG: normal EKG, normal sinus rhythm, there are no previous tracings available for comparison.  Imaging: Ct Angio Neck W/cm &/or Wo/cm  01/31/2013  *RADIOLOGY REPORT*  Clinical Data:  72 year old male with neck pain times 1 day and elevated blood pressure.  History of diabetes.  History of prostate cancer.  CT ANGIOGRAPHY NECK  Technique:  Multidetector CT imaging of the neck was performed using the standard protocol during bolus administration of intravenous contrast.  Multiplanar CT image reconstructions including MIPs were obtained to evaluate the vascular anatomy. Carotid stenosis  measurements (when applicable) are obtained utilizing NASCET criteria, using the distal internal carotid diameter as the denominator.  Contrast:  80 ml Omnipaque-300.  Comparison:   None.  Findings:  Mild dependent atelectasis in the lung apices. In addition, there is suggestion of numerous pulmonary nodules, miliary pattern.  No superior mediastinal lymphadenopathy.  Negative thyroid, larynx, pharynx, parapharyngeal spaces, sublingual space, submandibular glands and parotid glands.  Cervical lymph  Cervical limits are at the upper limits of normal for size and number.  Previous paranasal sinus surgery.  Polypoid opacity partially visible in the nasal cavity.  Other visualized paranasal sinuses and mastoids are clear.  No acute osseous abnormality identified. Asymmetric left stylohyoid ligament calcification.  Negative visualized brain parenchyma and orbital apex soft tissues.  Vascular Findings: Mild to moderate aortic arch atherosclerosis, but mostly in the distal arch and proximal descending segment.  For vessel arch configuration, the left vertebral artery arises directly from the arch.  No great vessel origin stenosis.  Normal right CCA origin. Tortuous right common carotid artery.  Right carotid bifurcation widely patent.  Tortuous but otherwise negative cervical right ICA. Minimal right ICA siphon atherosclerosis.  Right ICA terminus, right MCA and right ACA origins are patent. There is a right posterior communicating artery which is patent.  No proximal right subclavian artery stenosis.  The right vertebral artery origin is patent, however 7 mm beyond its origin there is thrombosis of the vessel which is distended with clot (series 5 image 84, coronal image 88).  The vessel is thrombosed over a 16 mm segment, but is reconstituted from mild spinal artery collateral at the proximal V2 segment (C6 level).  Subsequently the right vertebral artery is patent throughout the V2 segment and to the skull base.  There  is mild V4 segment calcified atherosclerosis without stenosis.  The vertebrobasilar junction is patent.  Right PICA, AICA, and SCA origins are patent.  Left CCA is patent with mild to moderate tortuosity.  Left carotid bifurcation is widely patent.  Cervical left ICA is normal aside from tortuosity.  Left ICA siphon is patent with mild atherosclerosis. Left ICA terminus, left MCA and ACA origins are patent.  Left vertebral artery origin off the aortic arch is patent.  Left proximal V2 segment is tortuous.  Left vertebral artery is patent throughout the neck without stenosis.  Left PICA is normal.  No distal left vertebral artery stenosis.  No basilar stenosis.  Left PCA origin is patent.  Fetal type right PCA suspected.   Review of the MIP images confirms the above findings.  IMPRESSION: 1.  Short segment thrombosis of the right vertebral artery just beyond  its origin; 16 mm of the distal right V1 segment is thrombosed and distended with clot.  However, flow is reconstituted at the C6 vertebral level via the spinal artery collateral, and the more distal right vertebral artery is patent without stenosis. 2.  Otherwise negative neck CTA.  Mild atherosclerosis.  Incidental left vertebral artery origin off the aortic arch. 3.  Miliary pattern of tiny pulmonary nodules in the visualized lung apices. Main differential considerations include infection including atypical agents (TB, varicella, fungal) and metastatic disease.  A preliminary report without discrepancy to the above was issued by Dr. Jearld Lesch to Dr. Drue Novel at 0422 hours on 01/31/2013.   Original Report Authenticated By: Erskine Speed, M.D.    Assessment/Plan: Mathew Garcia is a 72 year old man with the PMH listed above who presented with the severe right neck pain and found to have a right vertebral artery dissection with clot.    1.  Right vertebral artery dissection with occlusion: Seen on the CT scan.  Occlusion starts just distal to the origin of the  right vertebral artery and extends about 1.6 cm distally.  This appears to be acute.  He has risk factors for vasculopathy with his diabetes, HTN, and HLD.  Currently he has no demonstrated neurologic defects on exam but is at high risk of both extension of the dissection as well as embolism.    - Q2 neuro checks for 24 hours  - Continue heparin per pharmacy.  Will need continued anti-coagulation for 3-6 months.   - Continue daily ASA therapy  - Restart home blood pressure medications as well as PRN labetalol to keep SBP <180.  - Risk stratification for stroke with HgBA1C, FLP  Leodis Sias, MD Internal Medicine Resident, PGY III Co-Chief Resident, Internal Medicine Pager: [phone] 01/31/2013 9:20 AM   I have seen and evaluated the patient. I have reviewed the above note and agree with the contents.    Denies smoking, + FH of DM  Neck pain that started acutely, no other associated symptoms.   Awake, alert, some problem with year.  PERRL, EOMI, VFF Strength full Sensation intact DTR 2+ and symmetric  R Vert dissection Anticoagulation, BP control for SBP < 180 Frequent neuro checks, if develops symptoms, could consider intervention.    Ritta Slot, MD [facility] [phone]  If 7pm- 7am, please page neurology on call at [phone].

## 2013-01-31 NOTE — ED Notes (Signed)
Pt back from CT

## 2013-01-31 NOTE — Progress Notes (Signed)
ANTICOAGULATION CONSULT NOTE - Follow Up Consult  Pharmacy Consult for Heparin and Coumadin Indication: Vertebral artery dissection  No Known Allergies  Labs:  Recent Labs  01/31/13 0130 01/31/13 0555 01/31/13 1515  HGB 13.9  --   --   HCT 38.7*  --   --   PLT 208  --   --   HEPARINUNFRC  --  <0.10* 0.50  CREATININE 1.88*  --   --     Estimated Creatinine Clearance: 36 ml/min (by C-G formula based on Cr of 1.88).  Marland Kitchen dextrose 5 % and 0.9% NaCl 50 mL/hr at 01/31/13 1700  . heparin 900 Units/hr (01/31/13 1700)     Assessment: 72 year old male with vertebral artery dissection.  Heparin level therapeutic at 0.50.  Consult received to start Coumadin this evening.  Planning continued anticoagulation for 3 to 6 months  Goal of Therapy:  INR 2-3 Heparin level 0.3-0.5 units/ml Monitor platelets by anticoagulation protocol: Yes   Plan:  1) Continue heparin at 900 units / hr 2) Follow up AM heparin level 3) Coumadin 7.5mg  today 4) Daily PT/INR monitoring 5) Will order Coumadin education materials once out of ICU  Estella Husk, Pharm.D., BCPS Clinical Pharmacist  Phone [phone] Pager [phone] 01/31/2013, 7:05 PM

## 2013-01-31 NOTE — Progress Notes (Signed)
[facility] [place] TEAM 1 - Stepdown/ICU TEAM  This patient was admitted this morning by Dr. Conley Rolls.  He has been seen for a followup visit.  I have spoken with his family at the bedside.  [facility] Team 1 has assumed the attending role.  Lonia Blood, MD [facility] Office  [phone] Pager [phone]  On-Call/Text Page:      [email or web address]      password [facility]

## 2013-01-31 NOTE — ED Notes (Signed)
Per [facility], pt. Is from home with  Complaint of right neck pain  as he got up today at 9am at the scale of 10/10. Pt. Was working with car whole day and pain got worst. Pt. Also has BP of 200/156mmhg. Denies dizziness, nor headache. Denies weakness on any extremities.

## 2013-01-31 NOTE — Progress Notes (Signed)
UR completed 

## 2013-01-31 NOTE — Progress Notes (Signed)
ANTICOAGULATION CONSULT NOTE - Follow Up Consult  Pharmacy Consult for Heparin Indication: Vertebral artery dissection  No Known Allergies  Labs:  Recent Labs  01/31/13 0130 01/31/13 0555 01/31/13 1515  HGB 13.9  --   --   HCT 38.7*  --   --   PLT 208  --   --   HEPARINUNFRC  --  <0.10* 0.50  CREATININE 1.88*  --   --     Estimated Creatinine Clearance: 36 ml/min (by C-G formula based on Cr of 1.88).  Assessment: 72 year old male with vertebral artery dissection.  Heparin level therapeutic at 0.50  Planning continued anticoagulation for 3 to 6 months  Goal of Therapy:  Heparin level 0.3-0.5 units/ml Monitor platelets by anticoagulation protocol: Yes   Plan:  1) Continue heparin at 900 units / hr 2) Follow up AM heparin level  Thank you. Okey Regal, PharmD [phone]  01/31/2013,3:49 PM

## 2013-01-31 NOTE — ED Provider Notes (Signed)
History     CSN: [id number]  Arrival date & time 01/31/13  0119   First MD Initiated Contact with Patient 01/31/13 0138      Chief Complaint  Patient presents with  . Neck Pain  . Hypertension    (Consider location/radiation/quality/duration/timing/severity/associated sxs/prior treatment) HPI Comments: Mathew Garcia is a 72 y.o. male with a history of diabetes, prostate cancer, hypertension and heart murmur presents emergency Department with a chief complaint of right-sided neck pain.  Patient reports that he woke up Thursday morning with severe 10/10 pain which has not been relieved by anything.  In addition he reports that his blood pressure has been elevated higher than usual and refractory to his medication Lopressor and amlodipine.  Patient reports medication compliance.  In addition patient denies any headaches, dizziness, nausea, vomiting, weakness of extremities, decreased sensation, syncope, change in vision or any recent trauma.  Note the patient does report prior to the neck pain patient was working on his car for long hours.  Patient has not experienced pain like this before.   The history is provided by the patient.    Past Medical History  Diagnosis Date  . Diabetes mellitus without complication   . Heart murmur   . Hypertension   . Prostate cancer     Past Surgical History  Procedure Laterality Date  . Prostate surgery      No family history on file.  History  Substance Use Topics  . Smoking status: Not on file  . Smokeless tobacco: Not on file  . Alcohol Use: Not on file      Review of Systems  Constitutional: Negative for fever, chills and appetite change.  HENT: Positive for neck pain. Negative for congestion and facial swelling.   Eyes: Negative for visual disturbance.  Respiratory: Negative for shortness of breath.   Cardiovascular: Negative for chest pain and leg swelling.  Gastrointestinal: Negative for abdominal pain.  Genitourinary:  Negative for dysuria, urgency and frequency.  Neurological: Negative for dizziness, syncope, weakness, light-headedness, numbness and headaches.  Psychiatric/Behavioral: Negative for confusion.  All other systems reviewed and are negative.    Allergies  Review of patient's allergies indicates no known allergies.  Home Medications   Current Outpatient Rx  Name  Route  Sig  Dispense  Refill  . amLODipine (NORVASC) 10 MG tablet   Oral   Take 10 mg by mouth every evening.         Marland Kitchen aspirin EC 81 MG tablet   Oral   Take 81 mg by mouth every morning.         Marland Kitchen glipiZIDE (GLUCOTROL) 10 MG tablet   Oral   Take 10 mg by mouth 2 (two) times daily before a meal.         . metoprolol (LOPRESSOR) 50 MG tablet   Oral   Take 50 mg by mouth 2 (two) times daily.         . simvastatin (ZOCOR) 20 MG tablet   Oral   Take 20 mg by mouth every evening.           BP 192/79  Pulse 73  Temp(Src) 98.6 F (37 C) (Oral)  SpO2 100%  Physical Exam  Nursing note and vitals reviewed. Constitutional: He appears well-developed and well-nourished. No distress.  HENT:  Head: Normocephalic and atraumatic.  Eyes: Conjunctivae and EOM are normal.  Neck: Normal range of motion. Neck supple.    No cervical bony tenderness. Decreased ROM d/t pain.  ttp over vertebral artery, no carotid bruits  Cardiovascular:  Intact distal pulses, capillary refill < 3 seconds  Abdominal:  Non pulsatile aorta   Musculoskeletal:  RUE: no pain w ROM, no muscular ttp. All other extremities with normal ROM  Neurological:  CN II-XII intact. Good coordination. No sensory deficit  Skin: He is not diaphoretic.  Skin intact    ED Course  Procedures (including critical care time)   Date: 01/31/2013  Rate: 71  Rhythm: normal sinus rhythm  QRS Axis: left  Intervals: normal  ST/T Wave abnormalities: normal  Conduction Disutrbances:left anterior fascicular block  Narrative Interpretation:   Old EKG  Reviewed: none available  Labs Reviewed  CBC WITH DIFFERENTIAL - Abnormal; Notable for the following:    HCT 38.7 (*)    All other components within normal limits  COMPREHENSIVE METABOLIC PANEL - Abnormal; Notable for the following:    Potassium 3.3 (*)    Glucose, Bld 205 (*)    Creatinine, Ser 1.88 (*)    Total Protein 8.6 (*)    Alkaline Phosphatase 122 (*)    GFR calc non Af Amer 35 (*)    GFR calc Af Amer 40 (*)    All other components within normal limits  URINALYSIS, ROUTINE W REFLEX MICROSCOPIC   No results found. CRITICAL CARE Performed by: Jaci Carrel   Total critical care time: 30  Critical care time was exclusive of separately billable procedures and treating other patients.  Critical care was necessary to treat or prevent imminent or life-threatening deterioration.  Critical care was time spent personally by me on the following activities: development of treatment plan with patient and/or surrogate as well as nursing, discussions with consultants, evaluation of patient's response to treatment, examination of patient, obtaining history from patient or surrogate, ordering and performing treatments and interventions, ordering and review of laboratory studies, ordering and review of radiographic studies, pulse oximetry and re-evaluation of patient's condition.   No diagnosis found.  CT Angio neck: (official imaging to be read by neuro specialist in the morning) #1- Just distal to the R vertebral artery origin there is complete occlusion, multifocal luminal narrowing as it extends cranially c/w steno-occlusive dissection.  #2- Miliary lung nodules, cannot r/o mets. Pt w hx of prostate CA. Imaging not consistant   Consult Neuro Hospitalist: Dr. Roseanne Reno- requests pt to be admitted at [facility]. There is no need to aggressively treat BP. Pt to be started on Heparin, No bolus and low dose.    MDM  Vertebral artery dissection  72 year old male with a history of  hypertension and hyperlipidemia presents emergency department with a chief complaint of acute onset of right neck pain.  Onset of symptoms began approximately 24 hours ago.  Patient denied all associated symptoms including dizziness, nausea, vomiting, ataxia, disequilibrium, headaches or vision changes.  BP 183/95  Pulse 73  Temp(Src) 98.6 F (37 C) (Oral)  Resp 15  SpO2 97% physical exam showed tenderness to palpation only over vertebral artery without any para muscular soreness.  Concern for dissection indicated CT angios neck which showed a vertebral artery dissection.  Pharmacy has been consult is to heparinize patient.  Neuro hospitalist consult as above.  Patient is to be admitted by [facility] and [facility] to [facility].  Case discussed with attending who agrees with treatment plan.           Jaci Carrel, [place] 01/31/13 0501

## 2013-01-31 NOTE — ED Notes (Signed)
Pt transported to CT ?

## 2013-01-31 NOTE — Progress Notes (Signed)
Mathew Garcia arrived to our unit about 0800. He is alert, MAE equally, VSS, heparin infusing at 9cc/hr. Christopher with neurology and Dr Sharon Seller notified of pts arrival.

## 2013-01-31 NOTE — H&P (Signed)
[facility] History and Physical  Mathew Garcia ZOX:[id number] DOB: November 15, 1941    PCP:   No primary provider on file.   Chief Complaint: right neck pain in a pulsatile manner.   HPI: Mathew Garcia is an 72 y.o. male with  history of diabetes, prostate cancer, hypertension and heart murmur, presents emergency Department complaining of severe right-sided neck pain. Patient reports that he woke up Thursday morning with severe pain of his right neck extending to his right ear.  He also noted that his BP was quite elevated despite taking Lopressor and amlodipine.  He really had no significant headaches, dizziness, nausea, vomiting, weakness of extremities, decreased sensation, syncope, change in vision or any recent trauma.  Work up in the ER showed a neck CTA  showed  (official imaging to be read by neuro specialist in the morning)  #1- Just distal to the R vertebral artery origin there is complete occlusion, multifocal luminal narrowing as it extends cranially c/w steno-occlusive dissection.  #2- Miliary lung nodules, cannot r/o mets. Pt w hx of prostate CA. Imaging not consistant.  EDP consulted neurologist who recommended IV Heparin without bolus and to transfer to [facility] under [facility] service.  Rewiew of Systems:  Constitutional: Negative for malaise, fever and chills. No significant weight loss or weight gain Eyes: Negative for eye pain, redness and discharge, diplopia, visual changes, or flashes of light. ENMT: Negative for ear pain, hoarseness, nasal congestion, sinus pressure and sore throat. No headaches; tinnitus, drooling, or problem swallowing. Cardiovascular: Negative for chest pain, palpitations, diaphoresis, dyspnea and peripheral edema. ; No orthopnea, PND Respiratory: Negative for cough, hemoptysis, wheezing and stridor. No pleuritic chestpain. Gastrointestinal: Negative for nausea, vomiting, diarrhea, constipation, abdominal pain, melena, blood in stool, hematemesis, jaundice and  rectal bleeding.    Genitourinary: Negative for frequency, dysuria, incontinence,flank pain and hematuria; Musculoskeletal: Negative for back pain and neck pain. Negative for swelling and trauma.;  Skin: . Negative for pruritus, rash, abrasions, bruising and skin lesion.; ulcerations Neuro: Negative for headache, lightheadedness and neck stiffness. Negative for weakness, altered level of consciousness , altered mental status, extremity weakness, burning feet, involuntary movement, seizure and syncope.  Psych: negative for anxiety, depression, insomnia, tearfulness, panic attacks, hallucinations, paranoia, suicidal or homicidal ideation    Past Medical History  Diagnosis Date  . Diabetes mellitus without complication   . Heart murmur   . Hypertension   . Prostate cancer     Past Surgical History  Procedure Laterality Date  . Prostate surgery      Medications:  HOME MEDS: Prior to Admission medications   Medication Sig Start Date End Date Taking? Authorizing Provider  amLODipine (NORVASC) 10 MG tablet Take 10 mg by mouth every evening.   Yes Historical Provider, MD  aspirin EC 81 MG tablet Take 81 mg by mouth every morning.   Yes Historical Provider, MD  glipiZIDE (GLUCOTROL) 10 MG tablet Take 10 mg by mouth 2 (two) times daily before a meal.   Yes Historical Provider, MD  metoprolol (LOPRESSOR) 50 MG tablet Take 50 mg by mouth 2 (two) times daily.   Yes Historical Provider, MD  simvastatin (ZOCOR) 20 MG tablet Take 20 mg by mouth every evening.   Yes Historical Provider, MD     Allergies:  No Known Allergies  Social History: He is retired.  He lives at home with his daughter.  He denied tobacco, alcohol or drug use.  Family History:  No premature CAD disease.  Non contributory.  Physical Exam: Filed Vitals:   01/31/13 0122 01/31/13 0430 01/31/13 0506  BP: 192/79 183/95   Pulse: 73 73   Temp: 98.6 F (37 C)    TempSrc: Oral    Resp:  15   Height:   5\' 5"  (1.651 m)   Weight:   83.915 kg (185 lb)  SpO2: 100% 97%    Blood pressure 183/95, pulse 73, temperature 98.6 F (37 C), temperature source Oral, resp. rate 15, height 5\' 5"  (1.651 m), weight 83.915 kg (185 lb), SpO2 97.00%.  GEN:  Pleasant patient lying in the stretcher in no acute distress; cooperative with exam. PSYCH:  alert and oriented x4; does not appear anxious or depressed; affect is appropriate. HEENT: Mucous membranes pink and anicteric; PERRLA; EOM intact; no cervical lymphadenopathy nor thyromegaly or carotid bruit; no JVD; There were no stridor. Neck is very supple.  Tenderness over the right posterior ear. Breasts:: Not examined CHEST WALL: No tenderness CHEST: Normal respiration, clear to auscultation bilaterally.  HEART: Regular rate and rhythm.  There is a 2/6 SEM with no rub, or gallops.   BACK: No kyphosis or scoliosis; no CVA tenderness ABDOMEN: soft and non-tender; no masses, no organomegaly, normal abdominal bowel sounds; no pannus; no intertriginous candida. There is no rebound and no distention. Rectal Exam: Not done EXTREMITIES: No bone or joint deformity; age-appropriate arthropathy of the hands and knees; no edema; no ulcerations.  There is no calf tenderness. Genitalia: not examined PULSES: 2+ and symmetric SKIN: Normal hydration no rash or ulceration CNS: Cranial nerves 2-12 grossly intact no focal lateralizing neurologic deficit.  Speech is fluent; uvula elevated with phonation, facial symmetry and tongue midline. DTR are normal bilaterally, cerebella exam is intact, barbinski is negative and strengths are equaled bilaterally.  No sensory loss.   Labs on Admission:  Basic Metabolic Panel:  Recent Labs Lab 01/31/13 0130  NA 140  K 3.3*  CL 102  CO2 24  GLUCOSE 205*  BUN 19  CREATININE 1.88*  CALCIUM 9.0   Liver Function Tests:  Recent Labs Lab 01/31/13 0130  AST 24  ALT 15  ALKPHOS 122*  BILITOT 0.5  PROT 8.6*  ALBUMIN 3.8   No results found for  this basename: LIPASE, AMYLASE,  in the last 168 hours No results found for this basename: AMMONIA,  in the last 168 hours CBC:  Recent Labs Lab 01/31/13 0130  WBC 6.8  NEUTROABS 2.9  HGB 13.9  HCT 38.7*  MCV 86.4  PLT 208   Cardiac Enzymes: No results found for this basename: CKTOTAL, CKMB, CKMBINDEX, TROPONINI,  in the last 168 hours  CBG: No results found for this basename: GLUCAP,  in the last 168 hours   Radiological Exams on Admission: No results found.  EKG: Independently reviewed. SR no acute ST-T changes.   Assessment/Plan Present on Admission:  . Vertebral artery dissection . HTN (hypertension) . DM (diabetes mellitus) . Hyperlipidemia  PLAN:  Will admit him to the SDU.  Will try not to treat his BP too aggressively.  IV heparin was given and will be continued.  I have made him NPO in case he needs any procedure.  For his DM, will use SSI, and give D5NS.  I have continued his home meds.  Dr Roseanne Reno will see him in consultation and will make further recommendation.   He is stable, full code, and will be admitted to [facility] service.  Thank you for allowing me to partake in the care of your  nice patient.  Other plans as per orders.  Code Status:FULL CODE.   Houston Siren, MD. [facility] Pager [phone] 7pm to 7am.  01/31/2013, 5:45 AM

## 2013-01-31 NOTE — ED Notes (Signed)
CBG 170 

## 2013-01-31 NOTE — ED Provider Notes (Signed)
Medical screening examination/treatment/procedure(s) were conducted as a shared visit with non-physician practitioner(s) and myself.  I personally evaluated the patient during the encounter Jones Skene, M.D.  Mathew Garcia is a 71 y.o. male presenting with acute onset neck/headache about 24 hours ago. On Wednesday, during the day, patient was working on a car and had his head bent down for long periods of time. Patient as he had an acute headache that night that persisted on and off throughout the day. Patient awoke during the night on Thursday with severe 10 out of 10, sharp, throbbing pain in the posterior right lateral aspect of his skull, there is no associated nausea, vomiting, disequilibrium, vertigo or dizziness. He denies any antecedent trauma, denies any recent illness, no productive cough, chest pain, shortness of breath, abdominal pain or diarrhea.  VITAL SIGNS:   Filed Vitals:   01/31/13 0616  BP: 175/82  Pulse: 83  Temp:   Resp: 13   CONSTITUTIONAL: Awake, oriented x4, appears non-toxic HENT: Atraumatic, normocephalic, oral mucosa pink and moist, airway patent. Nares patent without drainage. External ears normal. EYES: Conjunctiva clear, EOMI, PERRLA NECK: Trachea midline, non-tender, supple CARDIOVASCULAR: Normal heart rate, Normal rhythm, No murmurs, rubs, gallops PULMONARY/CHEST: Clear to auscultation, no rhonchi, wheezes, or rales. Symmetrical breath sounds. Non-tender. ABDOMINAL: Non-distended, soft, non-tender - no rebound or guarding.  BS normal. NEUROLOGIC: Non-focal, moving all four extremities, no gross sensory or motor deficits. EXTREMITIES: No clubbing, cyanosis, or edema SKIN: Warm, Dry, No erythema, No rash  JYN:WGNFAO Mathew Garcia is a 72 y.o. male who presented with concerning acute onset of headache/neck ache with a history of hypertension, CT angiogram of the neck reveals a dissection of the right vertebral artery. Patient be admitted by [facility] and  transferred to [facility]. PA discussed lowering blood pressure acutely with neural hospitalist, he replied not to do anything aggressively with blood pressure, we'll keep the patient's pain under control. Patient has been heparinized to reduce risk of stroke.      Jones Skene, MD 01/31/13 [phone]

## 2013-01-31 NOTE — Progress Notes (Signed)
ANTICOAGULATION CONSULT NOTE - Initial Consult  Pharmacy Consult for Heparin Indication: Vertebral artery dissection  No Known Allergies  Patient Measurements: Height: 5\' 5"  (165.1 cm) Weight: 185 lb (83.915 kg) IBW/kg (Calculated) : 61.5 Heparin Dosing Weight:   Vital Signs: Temp: 98.6 F (37 C) (02/21 0122) Temp src: Oral (02/21 0122) BP: 183/95 mmHg (02/21 0430) Pulse Rate: 73 (02/21 0430)  Labs:  Recent Labs  01/31/13 0130  HGB 13.9  HCT 38.7*  PLT 208  CREATININE 1.88*    Estimated Creatinine Clearance: 36.5 ml/min (by C-G formula based on Cr of 1.88).   Medical History: Past Medical History  Diagnosis Date  . Diabetes mellitus without complication   . Heart murmur   . Hypertension   . Prostate cancer     Medications:  Infusions:  . heparin    . [COMPLETED] sodium chloride Stopped (01/31/13 0513)    Assessment: Patient with Vertebral artery dissection.  Heparin per pharmacy ordered with no bolus and low goal.  Will dose with TIA goal of 0.3-0.5 Heparin level.   Goal of Therapy:  Heparin level 0.3-0.5 units/ml Monitor platelets by anticoagulation protocol: Yes   Plan:  Heparin drip at 900 units/hr Daily Heparin level and CBC Heparin level 8hr after drip starts  Aleene Davidson Crowford 01/31/2013,5:13 AM

## 2013-01-31 NOTE — ED Notes (Signed)
Dr. Rulon Abide informed about EMTALA. [facility] contacted. Mathew Gather, RN informed that Heparin level (unfractioned) was clicked off and sent via [place], NT despite scheduled for 1400. Pt had thought for years that his birthday was at Feb 02, 1941 and recently found out it was 12/27/1940. Pt legally changed birth date and had different armband before which is why the glucometer didn't recognize him as a patient. Pt CBG of 170 was confirmed via [place], NT. Pt informed that they ordered an NPO diet on him.

## 2013-01-31 NOTE — ED Notes (Signed)
Dr. Rulon Abide informed me the EMTALA was signed.

## 2013-02-01 ENCOUNTER — Other Ambulatory Visit (HOSPITAL_COMMUNITY): Payer: Medicare PPO

## 2013-02-01 ENCOUNTER — Inpatient Hospital Stay (HOSPITAL_COMMUNITY): Payer: Medicare PPO

## 2013-02-01 DIAGNOSIS — E785 Hyperlipidemia, unspecified: Secondary | ICD-10-CM

## 2013-02-01 LAB — GLUCOSE, CAPILLARY
Glucose-Capillary: 109 mg/dL — ABNORMAL HIGH (ref 70–99)
Glucose-Capillary: 118 mg/dL — ABNORMAL HIGH (ref 70–99)
Glucose-Capillary: 148 mg/dL — ABNORMAL HIGH (ref 70–99)
Glucose-Capillary: 162 mg/dL — ABNORMAL HIGH (ref 70–99)
Glucose-Capillary: 182 mg/dL — ABNORMAL HIGH (ref 70–99)
Glucose-Capillary: 198 mg/dL — ABNORMAL HIGH (ref 70–99)
Glucose-Capillary: 88 mg/dL (ref 70–99)

## 2013-02-01 LAB — CBC
HCT: 39.1 % (ref 39.0–52.0)
MCH: 30.8 pg (ref 26.0–34.0)
MCV: 85.4 fL (ref 78.0–100.0)
Platelets: 193 10*3/uL (ref 150–400)
RBC: 4.58 MIL/uL (ref 4.22–5.81)
WBC: 8.7 10*3/uL (ref 4.0–10.5)

## 2013-02-01 LAB — BASIC METABOLIC PANEL
CO2: 23 mEq/L (ref 19–32)
Calcium: 8.8 mg/dL (ref 8.4–10.5)
Chloride: 106 mEq/L (ref 96–112)
Glucose, Bld: 93 mg/dL (ref 70–99)
Sodium: 141 mEq/L (ref 135–145)

## 2013-02-01 LAB — PROTIME-INR: INR: 1.07 (ref 0.00–1.49)

## 2013-02-01 MED ORDER — OXYCODONE HCL 5 MG PO TABS: 5.0000 mg | ORAL_TABLET | ORAL | Status: DC | PRN

## 2013-02-01 MED ORDER — ACETAMINOPHEN 325 MG PO TABS
650.0000 mg | ORAL_TABLET | Freq: Four times a day (QID) | ORAL | Status: DC | PRN
  Administered 2013-02-03 (×2): 650 mg via ORAL
  Filled 2013-02-01 (×2): qty 2

## 2013-02-01 MED ORDER — MORPHINE SULFATE 2 MG/ML IJ SOLN: 1.0000 mg | INTRAMUSCULAR | Status: DC | PRN

## 2013-02-01 MED ORDER — WARFARIN SODIUM 7.5 MG PO TABS
7.5000 mg | ORAL_TABLET | Freq: Once | ORAL | Status: AC
  Administered 2013-02-01: 7.5 mg via ORAL
  Filled 2013-02-01: qty 1

## 2013-02-01 MED ORDER — SODIUM CHLORIDE 0.9 % IV SOLN
INTRAVENOUS | Status: DC
  Administered 2013-02-01 – 2013-02-02 (×2): via INTRAVENOUS

## 2013-02-01 MED ORDER — POTASSIUM CHLORIDE CRYS ER 20 MEQ PO TBCR
40.0000 meq | EXTENDED_RELEASE_TABLET | Freq: Two times a day (BID) | ORAL | Status: AC
  Administered 2013-02-01 (×2): 40 meq via ORAL
  Filled 2013-02-01 (×2): qty 2

## 2013-02-01 NOTE — Progress Notes (Signed)
ANTICOAGULATION CONSULT NOTE - Follow Up Consult  Pharmacy Consult for Heparin and Coumadin Indication: Vertebral artery dissection  No Known Allergies  Labs:  Recent Labs  01/31/13 0130 01/31/13 0555 01/31/13 1515 02/01/13 0450  HGB 13.9  --   --  14.1  HCT 38.7*  --   --  39.1  PLT 208  --   --  193  LABPROT  --   --   --  13.8  INR  --   --   --  1.07  HEPARINUNFRC  --  <0.10* 0.50 0.49  CREATININE 1.88*  --   --  1.63*    Estimated Creatinine Clearance: 41.6 ml/min (by C-G formula based on Cr of 1.63).  Assessment: 72 year old male with vertebral artery dissection.  Heparin level therapeutic at 0.49.  Started Coumadin 2/21. INR 1.0. No bleeding noted.  Planning continued anticoagulation for 3 to 6 months  Goal of Therapy:  INR 2-3 Heparin level 0.3-0.5 units/ml Monitor platelets by anticoagulation protocol: Yes   Plan:  1) Continue heparin at 900 units / hr 2) Follow up AM heparin level 3) Coumadin 7.5mg  today 02/01/2013  4) Daily PT/INR monitoring 5) Will order Coumadin education materials once out of ICU  Sheppard Coil, Pharm.D., BCPS Clinical Pharmacist  Phone [phone] Pager [phone] 02/01/2013, 12:12 PM

## 2013-02-01 NOTE — Progress Notes (Signed)
[facility] Progress Note [place] TEAM 1 - Stepdown/ICU TEAM   COLMAN BIRDWELL ZOX:[id number] DOB: [birth year] DOA: 01/31/2013 PCP: No primary provider on file.  Brief narrative: 72 y.o. male with history of diabetes, prostate cancer, hypertension and heart murmur, who presented to the emergency department complaining of severe right-sided neck pain. Patient reported that he woke up the morning of 2/20 with severe pain of his right neck extending to his right ear. He also noted that his BP was quite elevated despite taking lopressor and amlodipine. He really had no significant headaches, dizziness, nausea, vomiting, weakness of extremities, decreased sensation, syncope, change in vision or any recent trauma. In the ER a neck CTA revealed the following: #1- Just distal to the R vertebral artery origin there is complete occlusion, multifocal luminal narrowing as it extends cranially c/w steno-occlusive dissection.  #2- Miliary lung nodules, cannot r/o mets. Pt w hx of prostate CA. Imaging not consistant. EDP consulted neurologist who recommended IV Heparin without bolus and to transfer to [facility] under [facility] service.  Assessment/Plan:  R Verterbral Artery dissection Heparin per pharmacy, w/ plan for anticoagulation for 6 months + ASA QD - goal is to keep SBP <180 per Neuro - begin coumadin today (newer agents not studied for this indication to my knowledge)  Numerous pulmonary nodules No adenopathy noted on CT - pt denies hx of keeping birds, or known TB exposure - absolutely no sx whatsoever - check HR CT of chest to better eval  Acute vs/ chronic renal insufficiency crt was 1.12 February 2011 - crt is slowly improving - gently hydrate and follow trend  DM Reasonably well controlled at this time   HTN BP currently at our goal range   HLD On Zocor at home - cont and check lipid panel in AM  Mild hypokalemia Replace and follow - check Mg  Mildly elevated TSH Check FT4  Hx of Prostate  CA  Small perimembranous ventricular septal defect  Normal RV sytolic pressure via eval 2004  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  Code Status: FULL Family Communication:  Disposition Plan: SDU  Consultants: Neurology  Procedures: 2/21 - CTA of neck - Short segment thrombosis of the right vertebral artery just beyond its origin - Otherwise negative neck CTA - Miliary pattern of tiny pulmonary nodules in the visualized lung apices 2/21 - TTEejection fraction was in the range of 60% to 65% - perimembranous VSD is present with left to right shunt  Antibiotics: none  DVT prophylaxis: Fully anticoagulated  HPI/Subjective: Pt has no complaints today.  No more neck pain. Denies ha, n/v, vertigo/dizziness, or visual change.  Objective: Blood pressure 161/77, pulse 49, temperature 98.1 F (36.7 C), temperature source Oral, resp. rate 12, height 5\' 5"  (1.651 m), weight 82.1 kg (181 lb), SpO2 99.00%.  Intake/Output Summary (Last 24 hours) at 02/01/13 8119 Last data filed at 02/01/13 0600  Gross per 24 hour  Intake   1389 ml  Output   2101 ml  Net   -712 ml   Exam: General: No acute respiratory distress Lungs: Clear to auscultation bilaterally without wheezes or crackles Cardiovascular: Regular rate and rhythm without gallop or rub - prominent holosystolic M Abdomen: Nontender, nondistended, soft, bowel sounds positive, no rebound, no ascites, no appreciable mass Extremities: No significant cyanosis, clubbing, or edema bilateral lower extremities  Data Reviewed:  Basic Metabolic Panel:  Recent Labs Lab 01/31/13 0130 02/01/13 0450  NA 140 141  K 3.3* 3.4*  CL 102 106  CO2 24 23  GLUCOSE 205* 93  BUN 19 12  CREATININE 1.88* 1.63*  CALCIUM 9.0 8.8   Liver Function Tests:  Recent Labs Lab 01/31/13 0130  AST 24  ALT 15   ALKPHOS 122*  BILITOT 0.5  PROT 8.6*  ALBUMIN 3.8   CBC:  Recent Labs Lab 01/31/13 0130 02/01/13 0450  WBC 6.8 8.7  NEUTROABS 2.9  --   HGB 13.9 14.1  HCT 38.7* 39.1  MCV 86.4 85.4  PLT 208 193   CBG:  Recent Labs Lab 01/31/13 1328 01/31/13 1804 01/31/13 2033 01/31/13 2338 02/01/13 0409  GLUCAP 158* 164* 118* 198* 109*    Studies:  Recent x-ray studies have been reviewed in detail by the Attending Physician  Scheduled Meds:  Reviewed in detail by the Attending Physician   [facility] T  [facility] Office  [phone] Pager - Text Page per Amion as per below:  On-Call/Text Page:      [email or web address]      password [email or web address]  If 7PM-7AM, please contact night-coverage [email or web address] Password [email or web address] 02/01/2013, 8:14 AM   LOS: 1 day

## 2013-02-01 NOTE — Progress Notes (Signed)
Stroke Team Progress Note  HISTORY Mathew Garcia is an 72 y.o. male with a PMH significant for diabetes, hypertension, and prostate cancer s/p radical prostatectomy presented to the [facility] complaining of severe right neck pain. The pain was a sharp throbbing pain that ran from the base of the neck to the right ear. He states that it was present when he woke up 2/20 AM and that he was unable to move his head because of the pain. He denies any slurred speech, facial droop, weakness, numbness, tingling, double vision, loss of vision, or unsteadiness on his feet. He does note that today his blood pressure was elevated despite the fact that he took his usual blood pressure medications. 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 Pt states he is at his baseline.    OBJECTIVE Most recent Vital Signs: Filed Vitals:   02/01/13 0500 02/01/13 0600 02/01/13 0646 02/01/13 0800  BP:  161/77    Pulse: 51 49 49   Temp:    98.5 F (36.9 C)  TempSrc:    Oral  Resp: 12 12 12    Height:      Weight:      SpO2: 100% 98% 99%    CBG (last 3)   Recent Labs  01/31/13 2338 02/01/13 0409 02/01/13 0822  GLUCAP 198* 109* 96    IV Fluid Intake:   . dextrose 5 % and 0.9% NaCl 10 mL/hr at 02/01/13 0050  . heparin 900 Units/hr (02/01/13 0600)    MEDICATIONS  . amLODipine  10 mg Oral QPM  . aspirin EC  81 mg Oral q morning - 10a  . docusate sodium  100 mg Oral BID  . glipiZIDE  10 mg Oral BID AC  . insulin aspart  0-15 Units Subcutaneous Q4H  . metoprolol  100 mg Oral BID  . simvastatin  20 mg Oral QPM  . sodium chloride  3 mL Intravenous Q12H  . Warfarin - Pharmacist Dosing Inpatient   Does not apply q1800   PRN:  hydrALAZINE, HYDROmorphone (DILAUDID) injection, labetalol, ondansetron (ZOFRAN) IV, ondansetron  Diet:  Carb Control reg diet Activity:  Bedrest DVT Prophylaxis:  Heparin  gtt CLINICALLY SIGNIFICANT STUDIES Basic Metabolic Panel:  Recent Labs Lab 01/31/13 0130 02/01/13 0450  NA 140 141  K 3.3* 3.4*  CL 102 106  CO2 24 23  GLUCOSE 205* 93  BUN 19 12  CREATININE 1.88* 1.63*  CALCIUM 9.0 8.8   Liver Function Tests:  Recent Labs Lab 01/31/13 0130  AST 24  ALT 15  ALKPHOS 122*  BILITOT 0.5  PROT 8.6*  ALBUMIN 3.8   CBC:  Recent Labs Lab 01/31/13 0130 02/01/13 0450  WBC 6.8 8.7  NEUTROABS 2.9  --   HGB 13.9 14.1  HCT 38.7* 39.1  MCV 86.4 85.4  PLT 208 193   Coagulation:  Recent Labs Lab 02/01/13 0450  LABPROT 13.8  INR 1.07   Cardiac Enzymes: No results found for this basename: CKTOTAL, CKMB, CKMBINDEX, TROPONINI,  in the last 168 hours Urinalysis:  Recent Labs Lab 01/31/13 0320  COLORURINE YELLOW  LABSPEC 1.013  PHURINE 6.5  GLUCOSEU 100*  HGBUR MODERATE*  BILIRUBINUR NEGATIVE  KETONESUR NEGATIVE  PROTEINUR 100*  UROBILINOGEN 0.2  NITRITE NEGATIVE  LEUKOCYTESUR NEGATIVE   Lipid Panel No results found for this basename: chol, trig, hdl, cholhdl, vldl, ldlcalc   HgbA1C  No results  found for this basename: HGBA1C    Urine Drug Screen:   No results found for this basename: labopia, cocainscrnur, labbenz, amphetmu, thcu, labbarb    Alcohol Level: No results found for this basename: ETH,  in the last 168 hours  Ct Angio Neck W/cm &/or Wo/cm  01/31/2013  *RADIOLOGY REPORT*  Clinical Data:  72 year old male with neck pain times 1 day and elevated blood pressure.  History of diabetes.  History of prostate cancer.  CT ANGIOGRAPHY NECK  Technique:  Multidetector CT imaging of the neck was performed using the standard protocol during bolus administration of intravenous contrast.  Multiplanar CT image reconstructions including MIPs were obtained to evaluate the vascular anatomy. Carotid stenosis measurements (when applicable) are obtained utilizing NASCET criteria, using the distal internal carotid diameter as the denominator.   Contrast:  80 ml Omnipaque-300.  Comparison:   None.  Findings:  Mild dependent atelectasis in the lung apices. In addition, there is suggestion of numerous pulmonary nodules, miliary pattern.  No superior mediastinal lymphadenopathy.  Negative thyroid, larynx, pharynx, parapharyngeal spaces, sublingual space, submandibular glands and parotid glands.  Cervical lymph  Cervical limits are at the upper limits of normal for size and number.  Previous paranasal sinus surgery.  Polypoid opacity partially visible in the nasal cavity.  Other visualized paranasal sinuses and mastoids are clear.  No acute osseous abnormality identified. Asymmetric left stylohyoid ligament calcification.  Negative visualized brain parenchyma and orbital apex soft tissues.  Vascular Findings: Mild to moderate aortic arch atherosclerosis, but mostly in the distal arch and proximal descending segment.  For vessel arch configuration, the left vertebral artery arises directly from the arch.  No great vessel origin stenosis.  Normal right CCA origin. Tortuous right common carotid artery.  Right carotid bifurcation widely patent.  Tortuous but otherwise negative cervical right ICA. Minimal right ICA siphon atherosclerosis.  Right ICA terminus, right MCA and right ACA origins are patent. There is a right posterior communicating artery which is patent.  No proximal right subclavian artery stenosis.  The right vertebral artery origin is patent, however 7 mm beyond its origin there is thrombosis of the vessel which is distended with clot (series 5 image 84, coronal image 88).  The vessel is thrombosed over a 16 mm segment, but is reconstituted from mild spinal artery collateral at the proximal V2 segment (C6 level).  Subsequently the right vertebral artery is patent throughout the V2 segment and to the skull base.  There is mild V4 segment calcified atherosclerosis without stenosis.  The vertebrobasilar junction is patent.  Right PICA, AICA, and SCA  origins are patent.  Left CCA is patent with mild to moderate tortuosity.  Left carotid bifurcation is widely patent.  Cervical left ICA is normal aside from tortuosity.  Left ICA siphon is patent with mild atherosclerosis. Left ICA terminus, left MCA and ACA origins are patent.  Left vertebral artery origin off the aortic arch is patent.  Left proximal V2 segment is tortuous.  Left vertebral artery is patent throughout the neck without stenosis.  Left PICA is normal.  No distal left vertebral artery stenosis.  No basilar stenosis.  Left PCA origin is patent.  Fetal type right PCA suspected.   Review of the MIP images confirms the above findings.  IMPRESSION: 1.  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  However, flow is reconstituted at the C6 vertebral  level via the spinal artery collateral, and the more distal right vertebral artery is patent without stenosis. 2.  Otherwise negative neck CTA.  Mild atherosclerosis.  Incidental left vertebral artery origin off the aortic arch. 3.  Miliary pattern of tiny pulmonary nodules in the visualized lung apices. Main differential considerations include infection including atypical agents (TB, varicella, fungal) and metastatic disease.  A preliminary report without discrepancy to the above was issued by Dr. Jearld Lesch to Dr. Drue Novel at 0422 hours on 01/31/2013.   Original Report Authenticated By: Erskine Speed, M.D.     CTA neck: Short segment thrombosis of the right vertebral artery just  beyond its origin; 16 mm of the distal right V1 segment is  thrombosed and distended with clot. However, flow is reconstituted  at the C6 vertebral level via the spinal artery collateral, and the  more distal right vertebral artery is patent without stenosis     MRI of the brain    MRA of the brain    2D Echocardiogram  Left ventricle: The cavity size was normal.  Wall thickness was increased in a pattern of mild LVH. Systolic function was normal. The estimated ejection fraction was in the range of 60% to 65%. Wall motion was normal; there were no regional wall motion abnormalities   Carotid Doppler    CXR   Therapy Recommendations  Pt/ot  Physical Exam  Neurologic Examination  Mental Status:  Alert, oriented, thought content appropriate. Speech fluent without evidence of aphasia. Able to follow 3 step commands without difficulty.  Cranial Nerves:  II: visual fields grossly normal, pupils equal, round, reactive to light and accommodation  III,IV, VI: ptosis not present, extra-ocular motions intact bilaterally  V,VII: smile symmetric, facial light touch sensation normal bilaterally  VIII: hearing normal bilaterally  IX,X: gag reflex present  XI: trapezius strength/neck flexion strength normal bilaterally  XII: tongue strength normal  Motor:  Right : Upper extremity Left: Upper extremity  5/5 deltoid 5/5 deltoid  5/5 biceps 5/5 biceps  5/5 triceps 5/5 triceps  5/5wrist flexion 5/5 wrist flexion  5/5 wrist extension 5/5 wrist extension  5/5 hand grip 5/5 hand grip  Lower extremity Lower extremity  5/5 hip flexor 5/5 hip flexor  5/5 hip adductors 5/5 hip adductors  5/5 hip abductors 5/5 hip abductors  5/5 quadricep 5/5 quadriceps  5/5 hamstrings 5/5 hamstrings  5/5 plantar flexion 5/5 plantar flexion  5/5 plantar extension 5/5 plantar extension  Tone and bulk:normal tone throughout; no atrophy noted  Sensory: Pinprick and light touch intact throughout, bilaterally  Deep Tendon Reflexes: 2+ and symmetric in the brachioradialis, patellar, and ankle.  Plantars:  Right: downgoing Left: downgoing  Cerebellar:  normal finger-to-nose, normal rapid alternating movements and normal heel-to-shin test  Gait not tested.   ASSESSMENT Mr. 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 Currently does not complain of any pain, no focal neurological deficit at this time.    Hospital day # 1  TREATMENT/PLAN  Currently on Heparin gtt.  Will need to be anticoagulated for 6 months and then followed up by neurology  Frequent neurochecks   SBP <180  As pt is asymptomatic do not think that pt currently needs surgical intervention.    Pauletta Browns  02/01/2013 8:51 AM  I have personally obtained a history, examined the patient, evaluated imaging results, and formulated  the assessment and plan of care. I agree with the above.

## 2013-02-02 ENCOUNTER — Inpatient Hospital Stay (HOSPITAL_COMMUNITY): Payer: Medicare PPO

## 2013-02-02 LAB — BASIC METABOLIC PANEL
Chloride: 105 mEq/L (ref 96–112)
Creatinine, Ser: 1.89 mg/dL — ABNORMAL HIGH (ref 0.50–1.35)
GFR calc Af Amer: 40 mL/min — ABNORMAL LOW (ref >90)
GFR calc non Af Amer: 34 mL/min — ABNORMAL LOW (ref >90)

## 2013-02-02 LAB — CBC
Hemoglobin: 12.9 g/dL — ABNORMAL LOW (ref 13.0–17.0)
MCHC: 36 g/dL (ref 30.0–36.0)
Platelets: 198 10*3/uL (ref 150–400)
RBC: 4.2 MIL/uL — ABNORMAL LOW (ref 4.22–5.81)

## 2013-02-02 LAB — GLUCOSE, CAPILLARY
Glucose-Capillary: 116 mg/dL — ABNORMAL HIGH (ref 70–99)
Glucose-Capillary: 110 mg/dL — ABNORMAL HIGH (ref 70–99)
Glucose-Capillary: 138 mg/dL — ABNORMAL HIGH (ref 70–99)
Glucose-Capillary: 155 mg/dL — ABNORMAL HIGH (ref 70–99)

## 2013-02-02 LAB — HEPARIN LEVEL (UNFRACTIONATED): Heparin Unfractionated: 0.58 IU/mL (ref 0.30–0.70)

## 2013-02-02 LAB — PROTIME-INR
INR: 1.18 (ref 0.00–1.49)
Prothrombin Time: 14.8 seconds (ref 11.6–15.2)

## 2013-02-02 LAB — LIPID PANEL
LDL Cholesterol: 85 mg/dL (ref 0–99)
Triglycerides: 87 mg/dL (ref <150)
VLDL: 17 mg/dL (ref 0–40)

## 2013-02-02 LAB — MAGNESIUM: Magnesium: 1.8 mg/dL (ref 1.5–2.5)

## 2013-02-02 MED ORDER — INSULIN ASPART 100 UNIT/ML ~~LOC~~ SOLN
0.0000 [IU] | Freq: Three times a day (TID) | SUBCUTANEOUS | Status: DC
  Administered 2013-02-03: 3 [IU] via SUBCUTANEOUS

## 2013-02-02 MED ORDER — HEPARIN (PORCINE) IN NACL 100-0.45 UNIT/ML-% IJ SOLN
800.0000 [IU]/h | INTRAMUSCULAR | Status: DC
  Administered 2013-02-03: 800 [IU]/h via INTRAVENOUS
  Filled 2013-02-02 (×2): qty 250

## 2013-02-02 MED ORDER — CARVEDILOL 6.25 MG PO TABS
6.2500 mg | ORAL_TABLET | Freq: Two times a day (BID) | ORAL | Status: DC
  Administered 2013-02-02 – 2013-02-04 (×4): 6.25 mg via ORAL
  Filled 2013-02-02 (×6): qty 1

## 2013-02-02 MED ORDER — WARFARIN SODIUM 10 MG PO TABS
10.0000 mg | ORAL_TABLET | Freq: Once | ORAL | Status: AC
  Administered 2013-02-02: 10 mg via ORAL
  Filled 2013-02-02: qty 1

## 2013-02-02 NOTE — Progress Notes (Signed)
Stroke Team Progress Note  HISTORY Mathew Garcia is an 72 y.o. male with a PMH significant for diabetes, hypertension, and prostate cancer s/p radical prostatectomy presented to the [facility] complaining of severe right neck pain. The pain was a sharp throbbing pain that ran from the base of the neck to the right ear. He states that it was present when he woke up 2/20 AM and that he was unable to move his head because of the pain. He denies any slurred speech, facial droop, weakness, numbness, tingling, double vision, loss of vision, or unsteadiness on his feet. He does note that today his blood pressure was elevated despite the fact that he took his usual blood pressure medications. 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 Pt states he is at his baseline.  Eating on his own.    OBJECTIVE Most recent Vital Signs: Filed Vitals:   02/02/13 0300 02/02/13 0400 02/02/13 0600 02/02/13 0700  BP: 175/78 155/68 167/97   Pulse: 65 62 78   Temp:    98 F (36.7 C)  TempSrc:    Oral  Resp: 14 17 17    Height:      Weight:      SpO2: 98% 98% 99%    CBG (last 3)   Recent Labs  02/01/13 2319 02/02/13 0150 02/02/13 0219  GLUCAP 162* 67* 116*    IV Fluid Intake:   . sodium chloride 75 mL/hr at 02/02/13 0300  . heparin 900 Units/hr (02/02/13 2956)    MEDICATIONS  . amLODipine  10 mg Oral QPM  . aspirin EC  81 mg Oral q morning - 10a  . docusate sodium  100 mg Oral BID  . glipiZIDE  10 mg Oral BID AC  . insulin aspart  0-15 Units Subcutaneous Q4H  . metoprolol  100 mg Oral BID  . simvastatin  20 mg Oral QPM  . sodium chloride  3 mL Intravenous Q12H  . Warfarin - Pharmacist Dosing Inpatient   Does not apply q1800   PRN:  acetaminophen, hydrALAZINE, labetalol, morphine injection, ondansetron (ZOFRAN) IV, ondansetron, oxyCODONE  Diet:  Carb Control reg diet Activity:  Bedrest DVT  Prophylaxis:  Heparin gtt CLINICALLY SIGNIFICANT STUDIES Basic Metabolic Panel:   Recent Labs Lab 02/01/13 0450 02/02/13 0428  NA 141 136  K 3.4* 3.7  CL 106 105  CO2 23 21  GLUCOSE 93 192*  BUN 12 16  CREATININE 1.63* 1.89*  CALCIUM 8.8 8.7  MG  --  1.8   Liver Function Tests:   Recent Labs Lab 01/31/13 0130  AST 24  ALT 15  ALKPHOS 122*  BILITOT 0.5  PROT 8.6*  ALBUMIN 3.8   CBC:   Recent Labs Lab 01/31/13 0130 02/01/13 0450 02/02/13 0428  WBC 6.8 8.7 9.6  NEUTROABS 2.9  --   --   HGB 13.9 14.1 12.9*  HCT 38.7* 39.1 35.8*  MCV 86.4 85.4 85.2  PLT 208 193 198   Coagulation:   Recent Labs Lab 02/01/13 0450 02/02/13 0428  LABPROT 13.8 14.8  INR 1.07 1.18   Cardiac Enzymes: No results found for this basename: CKTOTAL, CKMB, CKMBINDEX, TROPONINI,  in the last 168 hours Urinalysis:   Recent Labs Lab 01/31/13 0320  COLORURINE YELLOW  LABSPEC 1.013  PHURINE 6.5  GLUCOSEU 100*  HGBUR MODERATE*  BILIRUBINUR NEGATIVE  KETONESUR NEGATIVE  PROTEINUR 100*  UROBILINOGEN 0.2  NITRITE NEGATIVE  LEUKOCYTESUR NEGATIVE   Lipid Panel    Component Value Date/Time   CHOL 155 02/02/2013 0428   HgbA1C  No results found for this basename: HGBA1C    Urine Drug Screen:   No results found for this basename: labopia,  cocainscrnur,  labbenz,  amphetmu,  thcu,  labbarb    Alcohol Level: No results found for this basename: ETH,  in the last 168 hours  Ct Chest Wo Contrast  02/01/2013  *RADIOLOGY REPORT*  Clinical Data: Biliary nodules seen on CT neck yesterday.  Renal insufficiency.  Hypertension.  Diabetes.  CT CHEST WITHOUT CONTRAST  Technique:  Multidetector CT imaging of the chest was performed following the standard protocol without IV contrast.Dedicated high resolution images were also performed.  Comparison: CTA neck of 1 day prior.  Plain film chest of 02/12/2007.  Findings: Lungs/pleura: Upper lobe predominant micro nodularity, without lymphatic or  centrilobular distribution.  No other interstitial lung disease identified on high-resolution images.  No significant air trapping on expiration.  No pleural fluid.  Minimal right-sided pleural thickening.  Heart/Mediastinum: Mild cardiomegaly.  No pericardial effusion. Partially calcified mediastinal lymph nodes.  Index right paratracheal node measures 1.2 cm on image 24/series 5 1.5 cm azygo-esophageal recess node on image 33/series 5.  No definite hilar adenopathy, given limitation of unenhanced CT.  Fluid level in the distal thoracic esophagus on image 47.  Tiny focus of increased density within could be from enteric contents (i.e. a pill).   Upper abdomen: Bilateral renal atrophy, incompletely imaged. Suspect an upper pole left renal lesion on image 66/series 5.  Only minimally imaged.  Bones/Musculoskeletal:  No acute osseous abnormality.  IMPRESSION:  1.  Apical/upper lobe predominant micronodules.  Concurrent partially calcified mediastinal adenopathy.  Favor pneumoconiosis/silicosis.  Prior granulomatous infection felt less likely. 2. Esophageal air fluid level suggests dysmotility or gastroesophageal reflux. 3.  Renal atrophy.  Possible upper pole left renal lesion, incompletely imaged.  Renal ultrasound could be performed.   Original Report Authenticated By: Jeronimo Greaves, M.D.     CTA neck: Short segment thrombosis of the right vertebral artery just  beyond its origin; 16 mm of the distal right V1 segment is  thrombosed and distended with clot. However, flow is reconstituted  at the C6 vertebral level via the spinal artery collateral, and the  more distal right vertebral artery is patent without stenosis     MRI of the brain    MRA of the brain    2D Echocardiogram  Left ventricle: The cavity size was normal. Wall thickness was increased in a pattern of mild LVH. Systolic function was normal. The estimated ejection fraction was in the range of 60% to 65%. Wall motion was normal; there were  no regional wall motion abnormalities   Carotid Doppler    CXR   Therapy Recommendations  Pt/ot  Physical Exam  Neurologic Examination  Mental Status:  Alert, oriented, thought content appropriate. Speech fluent without evidence of aphasia. Able to follow 3 step commands without difficulty.  Cranial Nerves:  II: visual fields grossly normal, pupils equal, round, reactive to light and accommodation  III,IV, VI: ptosis not present, extra-ocular motions intact bilaterally  V,VII: smile symmetric, facial light touch sensation normal bilaterally  VIII: hearing normal bilaterally  IX,X: gag reflex present  XI: trapezius strength/neck flexion strength normal bilaterally  XII: tongue strength normal  Motor:  Right : Upper extremity Left: Upper extremity  5/5 deltoid 5/5 deltoid  5/5 biceps 5/5 biceps  5/5 triceps 5/5 triceps  5/5wrist flexion 5/5 wrist flexion  5/5 wrist extension 5/5 wrist extension  5/5 hand grip 5/5 hand grip  Lower extremity Lower extremity  5/5 hip flexor 5/5 hip flexor  5/5 hip adductors 5/5 hip adductors  5/5 hip abductors 5/5 hip abductors  5/5 quadricep 5/5 quadriceps  5/5 hamstrings 5/5 hamstrings  5/5 plantar flexion 5/5 plantar flexion  5/5 plantar extension 5/5 plantar extension  Tone and bulk:normal tone throughout; no atrophy noted  Sensory: Pinprick and light touch intact throughout, bilaterally  Deep Tendon Reflexes: 2+ and symmetric in the brachioradialis, patellar, and ankle.  Plantars:  Right: downgoing Left: downgoing  Cerebellar:  normal finger-to-nose, normal rapid alternating movements and normal heel-to-shin test  Gait not tested.   ASSESSMENT Mr. 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 Currently does not complain of any pain, no focal neurological deficit at this time.    Hospital day #  2  TREATMENT/PLAN  Currently on Heparin gtt.   Started Coumadin INR 1.18, goal 2-3  Will need to be anticoagulated for 6 months and then followed up by neurology  Frequent neurochecks   SBP <180  As pt is asymptomatic do not think that pt currently needs surgical intervention.   Will sign off please call with any questions.    Pauletta Browns  02/02/2013 8:27 AM  I have personally obtained a history, examined the patient, evaluated imaging results, and formulated the assessment and plan of care. I agree with the above.

## 2013-02-02 NOTE — Evaluation (Signed)
Physical Therapy Evaluation Patient Details Name: Mathew Garcia MRN: [id number] DOB: October 13, 1941 Today's Date: 02/02/2013 Time: 8469-6295 PT Time Calculation (min): 22 min  PT Assessment / Plan / Recommendation Clinical Impression  pt admitted with HA due to vertebral art .dissection.   On eval pt appears to be moving toward his baseline function.  Will see at least one more time on acute to check higher level balance activity.  Expect no follow up.    PT Assessment  Patient needs continued PT services    Follow Up Recommendations  No PT follow up    Does the patient have the potential to tolerate intense rehabilitation      Barriers to Discharge        Equipment Recommendations  None recommended by PT    Recommendations for Other Services     Frequency Min 2X/week    Precautions / Restrictions Precautions Precautions: None Restrictions Weight Bearing Restrictions: No   Pertinent Vitals/Pain       Mobility  Bed Mobility Bed Mobility: Not assessed Transfers Transfers: Sit to Stand;Stand to Sit Sit to Stand: 6: Modified independent (Device/Increase time) Stand to Sit: 6: Modified independent (Device/Increase time) Details for Transfer Assistance: safe mobility Ambulation/Gait Ambulation/Gait Assistance: 5: Supervision Ambulation Distance (Feet): 150 Feet Ambulation/Gait Assistance Details: generally safe mobility, tentative when asked to scan and turn around to look back over his shoulder, start/stop. Gait Pattern: Step-through pattern Stairs: No    Exercises     PT Diagnosis: Other (comment) (decr activity tolerance, tentative balance)  PT Problem List: Decreased activity tolerance;Decreased mobility;Decreased balance PT Treatment Interventions:     PT Goals Acute Rehab PT Goals PT Goal Formulation: With patient Time For Goal Achievement: 02/09/13 Potential to Achieve Goals: Good Pt will Ambulate: >150 feet;Independently PT Goal: Ambulate - Progress:  Goal set today Pt will Go Up / Down Stairs: 3-5 stairs;with modified independence;with rail(s) PT Goal: Up/Down Stairs - Progress: Goal set today Additional Goals Additional Goal #1: DGI 21 or greater PT Goal: Additional Goal #1 - Progress: Goal set today  Visit Information  Last PT Received On: 02/02/13 Assistance Needed: +1    Subjective Data  Patient Stated Goal: back home in [place]   Prior Functioning  Home Living Lives With: Daughter;Other (Comment) (grandchildren) Available Help at Discharge: Family Type of Home: House Home Access: Stairs to enter Secretary/administrator of Steps: 3 Entrance Stairs-Rails: Right;Left Home Layout: One level Bathroom Shower/Tub: Forensic psychologist: None Prior Function Level of Independence: Independent Able to Take Stairs?: Yes Driving: Yes Communication Communication: No difficulties    Cognition  Cognition Overall Cognitive Status: Appears within functional limits for tasks assessed/performed Arousal/Alertness: Awake/alert Orientation Level: Appears intact for tasks assessed Behavior During Session: [facility] for tasks performed    Extremity/Trunk Assessment Right Upper Extremity Assessment RUE ROM/Strength/Tone: [facility] for tasks assessed Left Upper Extremity Assessment LUE ROM/Strength/Tone: WFL for tasks assessed Right Lower Extremity Assessment RLE ROM/Strength/Tone: Within functional levels Left Lower Extremity Assessment LLE ROM/Strength/Tone: Within functional levels Trunk Assessment Trunk Assessment: Normal   Balance Balance Balance Assessed: No  End of Session PT - End of Session Activity Tolerance: Patient tolerated treatment well Patient left: in chair;with call bell/phone within reach Nurse Communication: Mobility status  GP     Mathew Garcia, Mathew Garcia 02/02/2013, 12:05 PM 02/02/2013  [place] Bing, PT [phone] (pager)

## 2013-02-02 NOTE — Progress Notes (Signed)
ANTICOAGULATION CONSULT NOTE - Follow Up Consult  Pharmacy Consult for Heparin and Coumadin Indication: Vertebral artery dissection  No Known Allergies  Labs:  Recent Labs  01/31/13 0130  01/31/13 1515 02/01/13 0450 02/02/13 0428  HGB 13.9  --   --  14.1 12.9*  HCT 38.7*  --   --  39.1 35.8*  PLT 208  --   --  193 198  LABPROT  --   --   --  13.8 14.8  INR  --   --   --  1.07 1.18  HEPARINUNFRC  --   < > 0.50 0.49 0.58  CREATININE 1.88*  --   --  1.63* 1.89*  < > = values in this interval not displayed.  Estimated Creatinine Clearance: 35.9 ml/min (by C-G formula based on Cr of 1.89).  Assessment: 72 year old male with vertebral artery dissection.  Heparin level now above low range goal at 0.58.  Started Coumadin 2/21. INR starting to trend up at 1.18. No bleeding noted.  Planning continued anticoagulation for 3 to 6 months  Goal of Therapy:  INR 2-3 Heparin level 0.3-0.5 units/ml Monitor platelets by anticoagulation protocol: Yes   Plan:  1) Decrease heparin to 800 units / hr 2) Follow up AM heparin level 3) Coumadin 10 mg today 02/02/2013  4) Daily PT/INR monitoring 5) Will order Coumadin education materials once out of ICU  Sheppard Coil, Pharm.D., BCPS Clinical Pharmacist  Phone [phone] Pager [phone] 02/02/2013, 11:46 AM

## 2013-02-02 NOTE — Progress Notes (Signed)
[facility] Progress Note  TEAM 1 - Stepdown/ICU TEAM   Mathew Garcia:[id number] DOB: [birth year] DOA: 01/31/2013 PCP: Pt lives in [place] - is simply in town visiting  Brief narrative: 72 y.o. male with history of diabetes, prostate cancer, hypertension and heart murmur, who presented to the emergency department complaining of severe right-sided neck pain. Patient reported that he woke up the morning of 2/20 with severe pain of his right neck extending to his right ear. He also noted that his BP was quite elevated despite taking lopressor and amlodipine. He really had no significant headaches, dizziness, nausea, vomiting, weakness of extremities, decreased sensation, syncope, change in vision or any recent trauma. In the ER a neck CTA revealed the following: #1- Just distal to the R vertebral artery origin there is complete occlusion, multifocal luminal narrowing as it extends cranially c/w steno-occlusive dissection.  #2- Miliary lung nodules, cannot r/o mets. Pt w hx of prostate CA. Imaging not consistant. EDP consulted neurologist who recommended IV Heparin without bolus and to transfer to [facility] under [facility] service.  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 - Quantiferon gold pending, but very low suspicion (w/ no sx) so no isolation indicated   Acute vs/ chronic renal insufficiency crt was 1.12 February 2011 - crt is fluctuating - pt informs me his PCP is following his renal fxn "cause they  don't quite work normal" - recheck in AM  DM Reasonably well controlled at this time    HTN BP continues to fluctuate - change to coreg - titrate dose as needed  HLD On Zocor at home - continue   Mild hypokalemia Replaced - Mg is ok   Mildly elevated TSH FT4 pending   Hx of Prostate CA  Small perimembranous ventricular septal defect  Normal RV sytolic pressure via eval 2004  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 50% proximal RCA lesion followed by a 40% LAD lesion - Diagonal #1 had sequential 30 and 20% lesions - Circumflex was angiographically normal - to f/u w/ PCP in [place]  Possible upper pole left renal lesion Likely inconsequential - will check renal US   Code Status: FULL Family Communication: spoke w/ pt and daughters at bedside  Disposition Plan: Tele  Consultants: Neurology  Procedures: 2/21 - CTA of neck - Short segment thrombosis of the right vertebral artery just beyond its origin - Otherwise negative neck CTA - Miliary pattern of tiny pulmonary nodules in the visualized lung apices 2/21 - TTEejection fraction was in the range of 60% to 65% - perimembranous VSD is present with left to right shunt - no evidence of RV failure or wall thickness enlargement   Antibiotics: none  DVT prophylaxis: Fully anticoagulated  HPI/Subjective: Pt has no complaints today.  Denies ha, n/v, vertigo/dizziness, neck pain, or visual change.  Objective: Blood pressure 176/82, pulse 58, temperature 98 F (36.7 C), temperature source Oral, resp. rate 10, height 5\' 5"  (1.651 m), weight 82.1 kg (  181 lb), SpO2 99.00%.  Intake/Output Summary (Last 24 hours) at 02/02/13 1126 Last data filed at 02/02/13 1100  Gross per 24 hour  Intake   2652 ml  Output   2350 ml  Net    302 ml   Exam: General: No acute respiratory distress Lungs: Clear to auscultation bilaterally without wheezes or crackles Cardiovascular: Regular rate and rhythm without gallop or rub - prominent holosystolic M  w/o change  Abdomen: Nontender, nondistended, soft, bowel sounds positive, no rebound, no ascites, no appreciable mass Extremities: No significant cyanosis, clubbing, or edema bilateral lower extremities  Data Reviewed: Basic Metabolic Panel:  Recent Labs Lab 01/31/13 0130 02/01/13 0450 02/02/13 0428  NA 140 141 136  K 3.3* 3.4* 3.7  CL 102 106 105  CO2 24 23 21   GLUCOSE 205* 93 192*  BUN 19 12 16   CREATININE 1.88* 1.63* 1.89*  CALCIUM 9.0 8.8 8.7  MG  --   --  1.8   Liver Function Tests:  Recent Labs Lab 01/31/13 0130  AST 24  ALT 15  ALKPHOS 122*  BILITOT 0.5  PROT 8.6*  ALBUMIN 3.8   CBC:  Recent Labs Lab 01/31/13 0130 02/01/13 0450 02/02/13 0428  WBC 6.8 8.7 9.6  NEUTROABS 2.9  --   --   HGB 13.9 14.1 12.9*  HCT 38.7* 39.1 35.8*  MCV 86.4 85.4 85.2  PLT 208 193 198   CBG:  Recent Labs Lab 02/01/13 1612 02/01/13 2223 02/01/13 2319 02/02/13 0150 02/02/13 0219  GLUCAP 88 182* 162* 67* 116*    Studies:  Recent x-ray studies have been reviewed in detail by the Attending Physician  Scheduled Meds:  Reviewed in detail by the Attending Physician   [facility]  [facility] Office  [phone] Pager - Text Page per Amion as per below:  On-Call/Text Page:      [email or web address]      password [email or web address]  If 7PM-7AM, please contact night-coverage [email or web address] Password [facility] 02/02/2013, 11:26 AM   LOS: 2 days

## 2013-02-03 ENCOUNTER — Inpatient Hospital Stay (HOSPITAL_COMMUNITY): Payer: Medicare PPO

## 2013-02-03 DIAGNOSIS — R918 Other nonspecific abnormal finding of lung field: Secondary | ICD-10-CM | POA: Diagnosis present

## 2013-02-03 LAB — CBC
HCT: 40.4 % (ref 39.0–52.0)
Hemoglobin: 14.7 g/dL (ref 13.0–17.0)
MCHC: 36.4 g/dL — ABNORMAL HIGH (ref 30.0–36.0)
RDW: 14.5 % (ref 11.5–15.5)
WBC: 9.3 10*3/uL (ref 4.0–10.5)

## 2013-02-03 LAB — BASIC METABOLIC PANEL
Calcium: 9.9 mg/dL (ref 8.4–10.5)
Creatinine, Ser: 1.83 mg/dL — ABNORMAL HIGH (ref 0.50–1.35)
GFR calc Af Amer: 41 mL/min — ABNORMAL LOW (ref >90)
Sodium: 139 mEq/L (ref 135–145)

## 2013-02-03 LAB — GLUCOSE, CAPILLARY

## 2013-02-03 LAB — PROTIME-INR
INR: 1.54 — ABNORMAL HIGH (ref 0.00–1.49)
Prothrombin Time: 18 seconds — ABNORMAL HIGH (ref 11.6–15.2)

## 2013-02-03 LAB — HEPARIN LEVEL (UNFRACTIONATED): Heparin Unfractionated: 0.43 IU/mL (ref 0.30–0.70)

## 2013-02-03 MED ORDER — WARFARIN VIDEO
Freq: Once | Status: AC
  Administered 2013-02-04: 08:00:00

## 2013-02-03 MED ORDER — PATIENT'S GUIDE TO USING COUMADIN BOOK
Freq: Once | Status: AC
  Administered 2013-02-03: 17:00:00
  Filled 2013-02-03: qty 1

## 2013-02-03 MED ORDER — WARFARIN SODIUM 7.5 MG PO TABS
7.5000 mg | ORAL_TABLET | Freq: Once | ORAL | Status: AC
  Administered 2013-02-03: 7.5 mg via ORAL
  Filled 2013-02-03: qty 1

## 2013-02-03 NOTE — Evaluation (Signed)
Occupational Therapy Evaluation Patient Details Name: JUAN KISSOON MRN: [id number] DOB: [birth year] Today's Date: 02/03/2013 Time: 1050-1110 OT Time Calculation (min): 20 min  OT Assessment / Plan / Recommendation Clinical Impression  Pt is 72 y/o male dx HA after vertebral artery dissection. He is overall independent with functional mobility and transfers as assessed today for OT. He plans to return home (lives w/ Dtr and grandchildren) w/ PRN family assist. He reports that his R knee "buckles every now and then", but this did not occur during OT assessment. P.T. was made aware. He states that he does not currently have any further OT needs at this time. Will sign off.    OT Assessment  Patient does not need any further OT services    Follow Up Recommendations  No OT follow up    Barriers to Discharge      Equipment Recommendations  None recommended by OT    Recommendations for Other Services    Frequency       Precautions / Restrictions Precautions Precautions: None Restrictions Weight Bearing Restrictions: No   Pertinent Vitals/Pain No c/o pain    ADL  Eating/Feeding: Performed;Modified independent Where Assessed - Eating/Feeding: Bed level Grooming: Performed;Wash/dry hands;Wash/dry face;Modified independent (Standing at sink) Where Assessed - Grooming: Unsupported standing Upper Body Bathing: Simulated;Modified independent Where Assessed - Upper Body Bathing: Unsupported sitting;Unsupported standing Lower Body Bathing: Simulated;Modified independent Where Assessed - Lower Body Bathing: Unsupported sitting;Unsupported sit to stand Upper Body Dressing: Simulated;Modified independent Where Assessed - Upper Body Dressing: Unsupported sitting Lower Body Dressing: Performed;Modified independent Where Assessed - Lower Body Dressing: Unsupported sitting;Unsupported sit to stand Toilet Transfer: Performed;Modified independent Toilet Transfer Method: Sit to stand;Other  (comment) (Amb into bathroom w/o assistive device) Toilet Transfer Equipment: Comfort height toilet Toileting - Clothing Manipulation and Hygiene: Simulated;Independent Where Assessed - Toileting Clothing Manipulation and Hygiene: Sit on 3-in-1 or toilet;Sit to stand from 3-in-1 or toilet Tub/Shower Transfer: Simulated;Supervision/safety Tub/Shower Transfer Method: Ambulating Equipment Used: Gait belt Transfers/Ambulation Related to ADLs: Pt is overall independent with functional mobility and transfers in room, bathroom as assesed today. ADL Comments: Pt is 72 y/o male dx HA after vertebral artery dissection. He is overall independent with functional mobility and transfers as assessed today for OT. He plans to return home (lives w/ Dtr and grandchildren) w/ PRN family assist. He reports that his R knee "buckles every now and then", but this did not occur during OT assessment. P.T. was made aware. He states that he does not currently have any further OT needs at this time. Will sign off.    OT Diagnosis:    OT Problem List:   OT Treatment Interventions:     OT Goals    Visit Information  Last OT Received On: 02/03/13 Assistance Needed: +1    Subjective Data  Subjective: Pt w/o c/o pain this am Patient Stated Goal: Return home when able   Prior Functioning     Home Living Lives With: Daughter;Other (Comment) (grandchildren) Available Help at Discharge: Family Type of Home: House Home Access: Stairs to enter Secretary/administrator of Steps: 3 Entrance Stairs-Rails: Right;Left Home Layout: One level Bathroom Shower/Tub: Forensic psychologist: None Prior Function Level of Independence: Independent Able to Take Stairs?: Yes Driving: Yes Communication Communication: No difficulties    Vision/Perception Vision - History Baseline Vision: Wears glasses only for reading Patient Visual Report: No change from baseline    Cognition  Cognition Overall Cognitive Status: Appears within functional limits  for tasks assessed/performed Arousal/Alertness: Awake/alert Orientation Level: Appears intact for tasks assessed Behavior During Session: [facility] for tasks performed    Extremity/Trunk Assessment Right Upper Extremity Assessment RUE ROM/Strength/Tone: Within functional levels RUE Sensation: WFL - Light Touch RUE Coordination: WFL - gross/fine motor Left Upper Extremity Assessment LUE ROM/Strength/Tone: Within functional levels LUE Sensation: WFL - Light Touch LUE Coordination: WFL - gross/fine motor Trunk Assessment Trunk Assessment: Normal     Mobility Bed Mobility Bed Mobility: Supine to Sit;Sitting - Scoot to Edge of Bed Supine to Sit: 7: Independent Sitting - Scoot to Edge of Bed: 7: Independent Transfers Transfers: Sit to Stand;Stand to Sit Sit to Stand: 6: Modified independent (Device/Increase time);From chair/3-in-1;From bed;From toilet Stand to Sit: 6: Modified independent (Device/Increase time);To toilet;To bed           End of Session OT - End of Session Equipment Utilized During Treatment: Gait belt Activity Tolerance: Patient tolerated treatment well Patient left: in bed;with call bell/phone within reach;with family/visitor present  GO     Charletta Cousin, Amy Beth Dixon 02/03/2013, 1:22 PM

## 2013-02-03 NOTE — Progress Notes (Signed)
ANTICOAGULATION CONSULT NOTE - Follow Up Consult  Pharmacy Consult for Heparin and Coumadin Indication: Vertebral artery dissection  No Known Allergies  Labs:  Recent Labs  02/01/13 0450 02/02/13 0428 02/03/13 0700  HGB 14.1 12.9* 14.7  HCT 39.1 35.8* 40.4  PLT 193 198 159  LABPROT 13.8 14.8 18.0*  INR 1.07 1.18 1.54*  HEPARINUNFRC 0.49 0.58 0.43  CREATININE 1.63* 1.89*  --     Estimated Creatinine Clearance: 35.4 ml/min (by C-G formula based on Cr of 1.89).  Assessment: 72 year old male with vertebral artery dissection.  Heparin level now above low range goal at 0.43.  Started Coumadin 2/21. INR starting to trend up at 1.54. No bleeding noted.  Planning continued anticoagulation for 3 to 6 months  Goal of Therapy:  INR 2-3 Heparin level 0.3-0.5 units/ml Monitor platelets by anticoagulation protocol: Yes   Plan:  1) Continue heparin at 800 units / hr 2) Follow up AM heparin level 3) Coumadin 7.5 mg today 02/03/2013  4) Daily PT/INR monitoring  Thank you. Okey Regal, PharmD [phone]  02/03/2013, 9:10 AM

## 2013-02-03 NOTE — Progress Notes (Addendum)
[facility] Progress Note    Mathew Garcia NWG:[id number] DOB: 1941-11-30 DOA: 01/31/2013 PCP: Pt lives in [place] - is simply in town visiting  Brief narrative: 72 y.o. male with history of diabetes, prostate cancer, hypertension and heart murmur, who presented to the emergency department complaining of severe right-sided neck pain. Patient reported that he woke up the morning of 2/20 with severe pain of his right neck extending to his right ear. He also noted that his BP was quite elevated despite taking lopressor and amlodipine. He really had no significant headaches, dizziness, nausea, vomiting, weakness of extremities, decreased sensation, syncope, change in vision or any recent trauma. In the ER a neck CTA revealed the following: #1- Just distal to the R vertebral artery origin there is complete occlusion, multifocal luminal narrowing as it extends cranially c/w steno-occlusive dissection.  #2- Miliary lung nodules, cannot r/o mets. Pt w hx of prostate CA. Imaging not consistant. EDP consulted neurologist who recommended IV Heparin without bolus and to transfer to [facility] under [facility] [place] [facility] service.  Assessment/Plan:  R Verterbral Artery dissection Heparin per pharmacy, w/ plan for anticoagulation for 6 months + ASA QD - goal is to keep SBP <180 per Neuro - coumadin per pharmacy  (newer agents not studied for this indication to my knowledge) - should be ready for d/c when INR at goal - will need to coordinate INR monitoring with PCP in [place]. INR 1/54.  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  DM Reasonably well controlled at this time. On Glipizide and SSI    HTN BP continues to fluctuate - change to coreg - titrate dose as needed. Continue Coreg (probably cant titrate up 2/2 SB 50's at times) and Amlodipine.  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 Disposition Plan: Not medically ready yet for discharge.   Consultants: Neurology  Procedures: 2/21 - CTA of neck - Short segment thrombosis of the right vertebral artery just beyond its origin - Otherwise negative neck CTA - Miliary pattern of tiny pulmonary nodules in the visualized lung apices 2/21 - TTEejection fraction was in the range of 60% to 65% - perimembranous VSD is present with left to right shunt - no evidence of RV failure or wall thickness enlargement   Antibiotics: none  DVT prophylaxis: Fully anticoagulated  HPI/Subjective: Mild occipital headache.   Objective: Blood pressure 166/95, pulse 82, temperature 99.2 F (37.3 C), temperature source  Oral, resp. rate 18, height 5\' 5"  (1.651 m), weight 80 kg (176 lb 5.9 oz), SpO2 99.00%.  Intake/Output Summary (Last 24 hours) at 02/03/13 1109 Last data filed at 02/03/13 1000  Gross per 24 hour  Intake    984 ml  Output   1800 ml  Net   -816 ml   Exam: General: No acute respiratory distress Lungs: Clear to auscultation bilaterally without wheezes or  crackles Cardiovascular: Regular rate and rhythm without gallop or rub - prominent holosystolic M w/o change  Telemetry: Sr-Sb in 50's Abdomen: Nontender, nondistended, soft, bowel sounds positive, no rebound, no ascites, no appreciable mass Extremities: Symmetric 5/5 power. Good peripheral pulses felt. CNS: Alert and oriented. No focal deficits.  Data Reviewed: Basic Metabolic Panel:  Recent Labs Lab 01/31/13 0130 02/01/13 0450 02/02/13 0428 02/03/13 0700  NA 140 141 136 139  K 3.3* 3.4* 3.7 3.9  CL 102 106 105 105  CO2 24 23 21 21   GLUCOSE 205* 93 192* 106*  BUN 19 12 16 16   CREATININE 1.88* 1.63* 1.89* 1.83*  CALCIUM 9.0 8.8 8.7 9.9  MG  --   --  1.8  --    Liver Function Tests:  Recent Labs Lab 01/31/13 0130  AST 24  ALT 15  ALKPHOS 122*  BILITOT 0.5  PROT 8.6*  ALBUMIN 3.8   CBC:  Recent Labs Lab 01/31/13 0130 02/01/13 0450 02/02/13 0428 02/03/13 0700  WBC 6.8 8.7 9.6 9.3  NEUTROABS 2.9  --   --   --   HGB 13.9 14.1 12.9* 14.7  HCT 38.7* 39.1 35.8* 40.4  MCV 86.4 85.4 85.2 85.2  PLT 208 193 198 159   CBG:  Recent Labs Lab 02/02/13 0748 02/02/13 1200 02/02/13 1659 02/02/13 2052 02/03/13 0728  GLUCAP 110* 131* 138* 155* 107*    Studies:  Recent x-ray studies have been reviewed in detail by the Attending Physician  Scheduled Meds:  Reviewed in detail by the Attending Physician   [facility]  [facility] Office  [phone] Pager [phone]  On-Call/Text Page:      [email or web address]      password [email or web address]  If 7PM-7AM, please contact night-coverage [email or web address] Password [email or web address] 02/03/2013, 11:09 AM   LOS: 3 days

## 2013-02-03 NOTE — Progress Notes (Signed)
@@@   D/C from PT at this time--No follow up@@@  Physical Therapy Treatment Patient Details Name: Mathew Garcia MRN: [id number] DOB: 10-24-1941 Today's Date: 02/03/2013 Time: 5784-6962 PT Time Calculation (min): 16 min  PT Assessment / Plan / Recommendation Comments on Treatment Session  pt's mobility at Independent level--DGI at 23 suggesting little to no risk of falling.  No further PT needs.  Sign off    Follow Up Recommendations  No PT follow up     Does the patient have the potential to tolerate intense rehabilitation     Barriers to Discharge        Equipment Recommendations  None recommended by PT    Recommendations for Other Services    Frequency     Plan All goals met and education completed, patient dischaged from PT services    Precautions / Restrictions Precautions Precautions: None Restrictions Weight Bearing Restrictions: No   Pertinent Vitals/Pain     Mobility  Bed Mobility Bed Mobility: Supine to Sit;Sitting - Scoot to Edge of Bed Supine to Sit: 7: Independent Sitting - Scoot to Edge of Bed: 7: Independent Transfers Transfers: Sit to Stand;Stand to Sit Sit to Stand: 6: Modified independent (Device/Increase time);From chair/3-in-1;From bed;From toilet Stand to Sit: 6: Modified independent (Device/Increase time);To toilet;To bed Details for Transfer Assistance: safe mobility Ambulation/Gait Ambulation/Gait Assistance: 7: Independent Ambulation Distance (Feet): 600 Feet Assistive device: None Ambulation/Gait Assistance Details: safe mobility see DGI Gait Pattern: Within Functional Limits Stairs: Yes Stairs Assistance: 6: Modified independent (Device/Increase time) Stair Management Technique: One rail Right;Alternating pattern;Step to pattern;Forwards Number of Stairs: 3 Wheelchair Mobility Wheelchair Mobility: No    Exercises     PT Diagnosis:    PT Problem List:   PT Treatment Interventions:     PT Goals Acute Rehab PT Goals Time For Goal  Achievement: 02/09/13 Potential to Achieve Goals: Good Pt will Ambulate: >150 feet;Independently PT Goal: Ambulate - Progress: Met Pt will Go Up / Down Stairs: 3-5 stairs;with modified independence;with rail(s) PT Goal: Up/Down Stairs - Progress: Met Additional Goals Additional Goal #1: DGI 21 or greater PT Goal: Additional Goal #1 - Progress: Met  Visit Information  Last PT Received On: 02/03/13 Assistance Needed: +1    Subjective Data  Subjective: My R knee has been giving me trouble lately... probably arthritis   Cognition  Cognition Overall Cognitive Status: Appears within functional limits for tasks assessed/performed Arousal/Alertness: Awake/alert Orientation Level: Appears intact for tasks assessed Behavior During Session: [facility] for tasks performed    Balance  Standardized Balance Assessment Standardized Balance Assessment: Dynamic Gait Index Dynamic Gait Index Level Surface: Normal Change in Gait Speed: Normal Gait with Horizontal Head Turns: Normal Gait with Vertical Head Turns: Normal Gait and Pivot Turn: Normal Step Over Obstacle: Normal Step Around Obstacles: Normal Steps: Mild Impairment Total Score: 23  End of Session PT - End of Session Activity Tolerance: Patient tolerated treatment well Patient left:  (left with family) Nurse Communication: Mobility status   GP     Shahiem Bedwell, Eliseo Gum 02/03/2013, 4:35 PM

## 2013-02-04 LAB — CBC
HCT: 37.2 % — ABNORMAL LOW (ref 39.0–52.0)
Hemoglobin: 13.5 g/dL (ref 13.0–17.0)
RBC: 4.39 MIL/uL (ref 4.22–5.81)
WBC: 9.3 10*3/uL (ref 4.0–10.5)

## 2013-02-04 LAB — PROTIME-INR
INR: 2.11 — ABNORMAL HIGH (ref 0.00–1.49)
Prothrombin Time: 22.8 seconds — ABNORMAL HIGH (ref 11.6–15.2)

## 2013-02-04 LAB — BASIC METABOLIC PANEL
Chloride: 103 mEq/L (ref 96–112)
Creatinine, Ser: 1.95 mg/dL — ABNORMAL HIGH (ref 0.50–1.35)
GFR calc Af Amer: 38 mL/min — ABNORMAL LOW (ref >90)
Potassium: 3.8 mEq/L (ref 3.5–5.1)
Sodium: 138 mEq/L (ref 135–145)

## 2013-02-04 LAB — HEPARIN LEVEL (UNFRACTIONATED): Heparin Unfractionated: 0.36 IU/mL (ref 0.30–0.70)

## 2013-02-04 MED ORDER — WARFARIN SODIUM 2.5 MG PO TABS
2.5000 mg | ORAL_TABLET | Freq: Once | ORAL | Status: AC
  Administered 2013-02-04: 2.5 mg via ORAL
  Filled 2013-02-04: qty 1

## 2013-02-04 MED ORDER — WARFARIN SODIUM 5 MG PO TABS: 5.0000 mg | ORAL_TABLET | ORAL | Status: DC

## 2013-02-04 MED ORDER — WARFARIN SODIUM 2.5 MG PO TABS
2.5000 mg | ORAL_TABLET | Freq: Once | ORAL | Status: DC
  Filled 2013-02-04: qty 1

## 2013-02-04 MED ORDER — CARVEDILOL 6.25 MG PO TABS: 6.2500 mg | ORAL_TABLET | Freq: Two times a day (BID) | ORAL | Status: DC

## 2013-02-04 MED ORDER — WARFARIN SODIUM 7.5 MG PO TABS: 7.5000 mg | ORAL_TABLET | ORAL | Status: DC

## 2013-02-04 NOTE — Progress Notes (Signed)
ANTICOAGULATION CONSULT NOTE - Follow Up Consult  Pharmacy Consult for Heparin and Coumadin Indication: Vertebral artery dissection  No Known Allergies  Labs:  Recent Labs  02/02/13 0428 02/03/13 0700 02/04/13 0500  HGB 12.9* 14.7 13.5  HCT 35.8* 40.4 37.2*  PLT 198 159 214  LABPROT 14.8 18.0* 22.8*  INR 1.18 1.54* 2.11*  HEPARINUNFRC 0.58 0.43 0.36  CREATININE 1.89* 1.83* 1.95*    Estimated Creatinine Clearance: 34.6 ml/min (by C-G formula based on Cr of 1.95).  Assessment: 72 year old male with vertebral artery dissection.  Heparin level now at goal  0.36.  Started Coumadin 2/21. INR therapeutic at 2.11. No bleeding noted.  Planning continued anticoagulation for 3 to 6 months  Goal of Therapy:  INR 2-3 Heparin level 0.3-0.5 units/ml Monitor platelets by anticoagulation protocol: Yes   Plan:  1) Continue heparin at 800 units / hr 2) Follow up AM heparin level 3) Coumadin 2.5 mg x 1 4) If home today, recommend 5 mg MWFSun, 7.5 TTS 5) Daily PT/INR monitoring  Thank you. Okey Regal, PharmD [phone]  02/04/2013, 9:51 AM

## 2013-02-05 NOTE — Discharge Summary (Addendum)
Physician Discharge Summary  DEJA KAIGLER JXB:[id number] DOB: 03-24-1941 DOA: 01/31/2013  PCP: No primary provider on file.  Admit date: 01/31/2013 Discharge date: 02/04/2013  Time spent: Greater than 30 minutes  Recommendations for Outpatient Follow-up:  1. With Dr. Wynetta Emery, PCP on 02/07/2013 at 9:40 am with repeat labs (PT, INR & BMP) 2. WIth Dr. Delia Heady, Neurology in 2 months 3. Follow up & evaluation of pulmonary nodules as deemed necessary. 4. Follow up & evaluation of complex right renal cyst as deemed necessary.  Discharge Diagnoses:  Principal Problem:   Vertebral artery dissection Active Problems:   HTN (hypertension)   DM (diabetes mellitus)   Heart murmur   Hyperlipidemia   Pulmonary nodules   CKD (chronic kidney disease), stage III   Discharge Condition: Improved & Stable  Diet recommendation: Heart healthy & diabetic diet.  Filed Weights   01/31/13 0815 02/02/13 2025 02/04/13 0602  Weight: 82.1 kg (181 lb) 80 kg (176 lb 5.9 oz) 81 kg (178 lb 9.2 oz)    History of present illness:  72 y.o. male with history of diabetes, prostate cancer, hypertension and heart murmur, who presented to the emergency department complaining of severe right-sided neck pain. Patient reported that he woke up the morning of 2/20 with severe pain of his right neck extending to his right ear. He also noted that his BP was quite elevated despite taking lopressor and amlodipine. He really had no significant headaches, dizziness, nausea, vomiting, weakness of extremities, decreased sensation, syncope, change in vision or any recent trauma. In the ER a neck CTA revealed the following:  #1- Just distal to the R vertebral artery origin there is complete occlusion, multifocal luminal narrowing as it extends cranially c/w steno-occlusive dissection.  #2- Miliary lung nodules, cannot r/o mets. Pt w hx of prostate CA. Imaging not consistant. EDP consulted neurologist who recommended IV Heparin  without bolus and to transfer to [facility] under [facility] service.  Hospital Course:  1. R Verterbral Artery dissection:  Neurology was consulted. They opined that occlusion starts just distal to the origin of the right vertebral artery and extends about 1.6 cm distally. This appears to be acute. He has risk factors for vasculopathy with his diabetes, HTN, and HLD. Currently he has no demonstrated neurologic defects on exam but is at high risk of both extension of the dissection as well as embolism. Per Neurology recommendations, he was started on Heparin and Coumadin anticoagulation and low dose Aspirin. They recommended 3-6 months anticoagulation. He has improved and is asymptomatic on day of discharge. On day of discharge, discussed with Stroke MD/Dr. Delia Heady who recommended d/c heparin (INR 2.11) and ASA, continue Coumadin (INR goal 2-3), avoid neck popping, lifting heavy weights > 20-30 lbs or strenuous activity and follow up with him in 2 months for repeat angiogram. At that time duration of anticoagulation will be determined. Patient/friend have already made appointment with PCP for follow up with blood tests 2. 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 3. Acute vs/ chronic renal insufficiency stage 3: crt was 1.12 February 2011 - crt is fluctuating - pt informs me his PCP is following his renal fxn "cause they don't quite work normal" - Creat 1.95- probably baseline.  Follow up with PCP. 4. DM: Reasonably well controlled at this time.  On Glipizide. 5. HTN: Continue Coreg and Amlodipine. Improved control. 6. HLD: On Zocor at home - continue.  7. Mild hypokalemia: Replaced - Mg is ok. 8. Mildly elevated TSH: FT4 normal. Clinically Euthyroid. 9. Hx of Prostate CA  10. Small perimembranous ventricular septal  defect: Normal RV sytolic pressure via eval 2004.  11. 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  12. Possible upper pole left renal lesion: Minimally complicated right complex renal cyst: OP follow up.   Procedures: 2/21 - CTA of neck - Short segment thrombosis of the right vertebral artery just beyond its origin - Otherwise negative neck CTA - Miliary pattern of tiny pulmonary nodules in the visualized lung apices  2/21 - TTEejection fraction was in the range of 60% to 65% - perimembranous VSD is present with left to right shunt - no evidence of RV failure or wall thickness enlargement   Consultations: Neurology  Discharge Exam:  Complaints: Denies complaints. No headache or neck pain.  Filed Vitals:   02/03/13 1402 02/03/13 2200 02/04/13 0602 02/04/13 1023  BP: 164/83 166/95 164/83 122/80  Pulse: 68 65 60 72  Temp: 98.9 F (37.2 C) 98.9 F (37.2 C) 98.3 F (36.8 C)   TempSrc: Oral Oral Oral   Resp: 16 18 18    Height:      Weight:   81 kg (178 lb 9.2 oz)   SpO2: 98% 100% 97%      General exam: Comfortable.  Respiratory system: Clear. No increased work of breathing.  Cardiovascular system: S1 and S2 heard, RRR. No JVD, murmurs or pedal edema. Telemetry: SB in 50's -SR  Gastrointestinal system: Abdomen is nondistended, soft and nontender. Normal bowel sounds heard.  Central nervous system: Alert and oriented. No focal neurological deficits.  Extremities: Symmetric 5 x 5 power.  Discharge Instructions  Discharge Orders   Future Orders Complete By Expires     Call MD for:  severe uncontrolled pain  As directed     Diet - low sodium heart healthy  As directed     Diet Carb Modified  As directed     Discharge instructions  As directed     Comments:      No neck popping and avoid lifting heavy weights (greater  than 20 pounds) or strenuous activity.    Increase activity slowly  As directed         Medication List    STOP taking these medications       aspirin EC 81 MG tablet     metoprolol 50 MG tablet  Commonly known as:  LOPRESSOR      TAKE these medications       amLODipine 10 MG tablet  Commonly known as:  NORVASC  Take 10 mg by mouth every evening.     carvedilol 6.25 MG tablet  Commonly known as:  COREG  Take 1 tablet (6.25 mg total) by mouth 2 (two) times daily with a meal.     glipiZIDE 10 MG tablet  Commonly known as:  GLUCOTROL  Take 10 mg by mouth 2 (two) times daily before a meal.     simvastatin 20 MG tablet  Commonly known as:  ZOCOR  Take 20 mg by mouth every evening.     warfarin 5 MG tablet  Commonly known as:  COUMADIN  Take 1 tablet (5 mg total) by mouth every Monday,Wednesday,Friday, and Sunday at 6 PM.     warfarin 7.5 MG tablet  Commonly known as:  COUMADIN  Take 1 tablet (7.5 mg total) by mouth every Tuesday, Thursday, and Saturday at 6 PM.  Start taking on:  02/06/2013           Follow-up Information   Follow up with Dr. Wynetta Emery, Primary Medical Doctor On 02/07/2013. (AT 9:40 am. To be seen with blood tests (PT, INR & BMP). Coumadin dose to be adjusted based on results.)       Follow up with Gates Rigg, MD. Schedule an appointment as soon as possible for a visit in 2 months.   Contact information:   [address], [address] [facility] [place] [address] [phone]        The results of significant diagnostics from this hospitalization (including imaging, microbiology, ancillary and laboratory) are listed below for reference.    Significant Diagnostic Studies: Ct Angio Neck W/cm &/or Wo/cm  01/31/2013  *RADIOLOGY REPORT*  Clinical Data:  73 year old male with neck pain times 1 day and elevated blood pressure.  History of diabetes.  History of prostate cancer.  CT ANGIOGRAPHY NECK  Technique:   Multidetector CT imaging of the neck was performed using the standard protocol during bolus administration of intravenous contrast.  Multiplanar CT image reconstructions including MIPs were obtained to evaluate the vascular anatomy. Carotid stenosis measurements (when applicable) are obtained utilizing NASCET criteria, using the distal internal carotid diameter as the denominator.  Contrast:  80 ml Omnipaque-300.  Comparison:   None.  Findings:  Mild dependent atelectasis in the lung apices. In addition, there is suggestion of numerous pulmonary nodules, miliary pattern.  No superior mediastinal lymphadenopathy.  Negative thyroid, larynx, pharynx, parapharyngeal spaces, sublingual space, submandibular glands and parotid glands.  Cervical lymph  Cervical limits are at the upper limits of normal for size and number.  Previous paranasal sinus surgery.  Polypoid opacity partially visible in the nasal cavity.  Other visualized paranasal sinuses and mastoids are clear.  No acute osseous abnormality identified. Asymmetric left stylohyoid ligament calcification.  Negative visualized brain parenchyma and orbital apex soft tissues.  Vascular Findings: Mild to moderate aortic arch atherosclerosis, but mostly in the distal arch and proximal descending segment.  For vessel arch configuration, the left vertebral artery arises directly from the arch.  No great vessel origin stenosis.  Normal right CCA origin. Tortuous right common carotid artery.  Right carotid bifurcation widely patent.  Tortuous but otherwise negative cervical right ICA. Minimal right ICA siphon atherosclerosis.  Right ICA terminus, right MCA and right ACA origins are patent. There is a right posterior communicating artery which is patent.  No proximal right subclavian artery stenosis.  The right vertebral artery origin is patent, however 7 mm beyond its origin there is thrombosis of the vessel which is distended with clot (series 5 image 84, coronal image 88).   The vessel is thrombosed over a 16 mm segment, but is reconstituted from mild spinal artery collateral at the proximal V2 segment (C6 level).  Subsequently the right vertebral artery is patent throughout the V2 segment and to the skull base.  There is mild V4 segment calcified atherosclerosis without stenosis.  The vertebrobasilar junction is patent.  Right PICA, AICA, and SCA origins are patent.  Left CCA is patent with mild to moderate tortuosity.  Left carotid bifurcation is widely patent.  Cervical left ICA is normal  aside from tortuosity.  Left ICA siphon is patent with mild atherosclerosis. Left ICA terminus, left MCA and ACA origins are patent.  Left vertebral artery origin off the aortic arch is patent.  Left proximal V2 segment is tortuous.  Left vertebral artery is patent throughout the neck without stenosis.  Left PICA is normal.  No distal left vertebral artery stenosis.  No basilar stenosis.  Left PCA origin is patent.  Fetal type right PCA suspected.   Review of the MIP images confirms the above findings.  IMPRESSION: 1.  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  However, flow is reconstituted at the C6 vertebral level via the spinal artery collateral, and the more distal right vertebral artery is patent without stenosis. 2.  Otherwise negative neck CTA.  Mild atherosclerosis.  Incidental left vertebral artery origin off the aortic arch. 3.  Miliary pattern of tiny pulmonary nodules in the visualized lung apices. Main differential considerations include infection including atypical agents (TB, varicella, fungal) and metastatic disease.  A preliminary report without discrepancy to the above was issued by Dr. Jearld Lesch to Dr. Drue Novel at 0422 hours on 01/31/2013.   Original Report Authenticated By: Erskine Speed, M.D.    Ct Chest Wo Contrast  02/01/2013  *RADIOLOGY REPORT*  Clinical Data:  Biliary nodules seen on CT neck yesterday.  Renal insufficiency.  Hypertension.  Diabetes.  CT CHEST WITHOUT CONTRAST  Technique:  Multidetector CT imaging of the chest was performed following the standard protocol without IV contrast.Dedicated high resolution images were also performed.  Comparison: CTA neck of 1 day prior.  Plain film chest of 02/12/2007.  Findings: Lungs/pleura: Upper lobe predominant micro nodularity, without lymphatic or centrilobular distribution.  No other interstitial lung disease identified on high-resolution images.  No significant air trapping on expiration.  No pleural fluid.  Minimal right-sided pleural thickening.  Heart/Mediastinum: Mild cardiomegaly.  No pericardial effusion. Partially calcified mediastinal lymph nodes.  Index right paratracheal node measures 1.2 cm on image 24/series 5 1.5 cm azygo-esophageal recess node on image 33/series 5.  No definite hilar adenopathy, given limitation of unenhanced CT.  Fluid level in the distal thoracic esophagus on image 47.  Tiny focus of increased density within could be from enteric contents (i.e. a pill).   Upper abdomen: Bilateral renal atrophy, incompletely imaged. Suspect an upper pole left renal lesion on image 66/series 5.  Only minimally imaged.  Bones/Musculoskeletal:  No acute osseous abnormality.  IMPRESSION:  1.  Apical/upper lobe predominant micronodules.  Concurrent partially calcified mediastinal adenopathy.  Favor pneumoconiosis/silicosis.  Prior granulomatous infection felt less likely. 2. Esophageal air fluid level suggests dysmotility or gastroesophageal reflux. 3.  Renal atrophy.  Possible upper pole left renal lesion, incompletely imaged.  Renal ultrasound could be performed.   Original Report Authenticated By: Jeronimo Greaves, M.D.    US Renal  02/03/2013  *RADIOLOGY REPORT*  Clinical Data:  Evaluate abnormality seen on chest CT upper upper pole left kidney.  RENAL/URINARY TRACT ULTRASOUND COMPLETE  Comparison:  Chest  CT 02/01/2013 and report of prior renal ultrasound 07/18/2000.  Findings:  Right Kidney:  Normal in size measuring 10.3 cm.  There is a 3.1 cm well-defined rounded cyst of the mid pole of the right kidney with very subtle homogeneous low level internal echoes.  Left Kidney:  Normal in size measuring 10.4 cm in diameter. There is minimal lobulation of the  left renal contour without focal mass or hydronephrosis.  Bladder:  Appears normal for degree of bladder distention.  IMPRESSION: Normal sized kidneys.  No focal left renal mass.  3.1 cm minimally complicated right renal cyst.   Original Report Authenticated By: Elberta Fortis, M.D.     Microbiology: No results found for this or any previous visit (from the past 240 hour(s)).   Labs: Basic Metabolic Panel:  Recent Labs Lab 01/31/13 0130 02/01/13 0450 02/02/13 0428 02/03/13 0700 02/04/13 0500  NA 140 141 136 139 138  K 3.3* 3.4* 3.7 3.9 3.8  CL 102 106 105 105 103  CO2 24 23 21 21 23   GLUCOSE 205* 93 192* 106* 97  BUN 19 12 16 16 21   CREATININE 1.88* 1.63* 1.89* 1.83* 1.95*  CALCIUM 9.0 8.8 8.7 9.9 9.2  MG  --   --  1.8  --   --    Liver Function Tests:  Recent Labs Lab 01/31/13 0130  AST 24  ALT 15  ALKPHOS 122*  BILITOT 0.5  PROT 8.6*  ALBUMIN 3.8   No results found for this basename: LIPASE, AMYLASE,  in the last 168 hours No results found for this basename: AMMONIA,  in the last 168 hours CBC:  Recent Labs Lab 01/31/13 0130 02/01/13 0450 02/02/13 0428 02/03/13 0700 02/04/13 0500  WBC 6.8 8.7 9.6 9.3 9.3  NEUTROABS 2.9  --   --   --   --   HGB 13.9 14.1 12.9* 14.7 13.5  HCT 38.7* 39.1 35.8* 40.4 37.2*  MCV 86.4 85.4 85.2 85.2 84.7  PLT 208 193 198 159 214   Cardiac Enzymes: No results found for this basename: CKTOTAL, CKMB, CKMBINDEX, TROPONINI,  in the last 168 hours BNP: BNP (last 3 results) No results found for this basename: PROBNP,  in the last 8760 hours CBG:  Recent Labs Lab 02/03/13 1230  02/03/13 1632 02/03/13 2052 02/04/13 0746 02/04/13 1144  GLUCAP 128* 178* 84 110* 117*    Additional labs:  Lipid panel: Cholesterol 155, triglycerides 87, HDL 53, LDL 85, VLDL 17  INR: 2.11 (02/04/13)  TSH: 5.52 and free T4 0.92  Mycobacteria/TB interferon gamma release assay: Negative  UA: Protein 100 mg but no features suggestive of UTI.  2-D echo 01/31/13: Study Conclusions  - Left ventricle: The cavity size was normal. Wall thickness was increased in a pattern of mild LVH. Systolic function was normal. The estimated ejection fraction was in the range of 60% to 65%. Wall motion was normal; there were no regional wall motion abnormalities. - Mitral valve: Mild regurgitation. - Left atrium: The atrium was mildly dilated. - Atrial septum: No defect or patent foramen ovale was identified. - Impressions: Perimembranous VSD is present with left to right shunt  Impressions:  - Perimembranous VSD is present with left to right shunt   Forgot to give details for Neurology follow up at discharge. Called patient 02/05/2013 and left message to call back, so this information can be provided. Also called patient's PCP's office and talked to office manager Ms. Misty Stanley and gave her the details for patients OP Neurology follow up and advised her to request patients medical records. Patient's girlfriend Ms. Wenokia called. Provided her with Neuro follow up details.    Signed:  Kayleena Eke  [facility] 02/05/2013, 8:04 AM

## 2013-11-05 ENCOUNTER — Encounter (HOSPITAL_COMMUNITY): Payer: Self-pay | Admitting: Emergency Medicine

## 2013-11-05 ENCOUNTER — Emergency Department (INDEPENDENT_AMBULATORY_CARE_PROVIDER_SITE_OTHER)
Admission: EM | Admit: 2013-11-05 | Discharge: 2013-11-05 | Disposition: A | Discharge status: Home / Self Care | Payer: Medicare PPO | Source: Home / Self Care | Attending: Emergency Medicine | Admitting: Emergency Medicine

## 2013-11-05 DIAGNOSIS — H9190 Unspecified hearing loss, unspecified ear: Secondary | ICD-10-CM

## 2013-11-05 DIAGNOSIS — H9193 Unspecified hearing loss, bilateral: Secondary | ICD-10-CM

## 2013-11-05 NOTE — ED Provider Notes (Signed)
Chief Complaint:   Chief Complaint  Patient presents with  . Hearing Problem    History of Present Illness:   Mathew Garcia is a 72 year old male who 2 weeks ago and noted decreased hearing in his left ear with ringing in his ear. He denies any ear pain or vertigo. There's been no drainage. He states his right ear has always been hard of hearing. He denies any headache, diplopia, blurry vision, or other neurological symptoms. He's had no nasal congestion, drainage, or rhinorrhea.  Review of Systems:  Other than noted above, the patient denies any of the following symptoms: Systemic:  No fevers, chills, sweats, weight loss or gain, fatigue, or tiredness. Eye:  No redness, pain, discharge, itching, blurred vision, or diplopia. ENT:  No headache, nasal congestion, sneezing, itching, epistaxis, ear pain, congestion, decreased hearing, ringing in ears, vertigo, or tinnitus.  No oral lesions, sore throat, pain on swallowing, or hoarseness. Neck:  No mass, tenderness or adenopathy. Lungs:  No coughing, wheezing, or shortness of breath. Skin:  No rash or itching.  PMFSH:  Past medical history, family history, social history, meds, and allergies were reviewed. He is allergic to codeine. He has high blood pressure, diabetes, hypercholesterolemia, and prostate cancer. He takes metoprolol, glipizide, amlodipine, aspirin, and simvastatin.  Physical Exam:   Vital signs:  BP 167/93  Pulse 55  Temp(Src) 98 F (36.7 C) (Oral)  Resp 18  SpO2 98% General:  Alert and oriented.  In no distress.  Skin warm and dry. Eye:  PERRL, full EOMs, lids and conjunctiva normal.   ENT:  TMs and canals clear.  Nasal mucosa not congested and without drainage.  Mucous membranes moist, no oral lesions, normal dentition, pharynx clear.  No cranial or facial pain to palplation. He is very hard of hearing. Hearing is diminished bilaterally to taking watch and whispered voices. He could hear fingers rubbing together just a  little bit. Neck:  Supple, full ROM.  No adenopathy, tenderness or mass.  Thyroid normal. Lungs:  Breath sounds clear and equal bilaterally.  No wheezes, rales or rhonchi. Heart:  Rhythm regular, without extrasystoles.  No gallops or murmers. Skin:  Clear, warm and dry.  Assessment:  The encounter diagnosis was Hearing loss, bilateral.  His ears look completely normal. 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 He will need to followup with ENT. Unfortunately just visiting his daughter here in town. He's going to be going back to [place] on Monday. Suggest he followup there with his primary care physician for referral to ENT. I told him it was likely that they would not be able to do much for him except to recommend hearing aids.  Plan:   1.  Meds:  The following meds were prescribed:   Discharge Medication List as of 11/05/2013  2:12 PM      2.  Patient Education/Counseling:  The patient was given appropriate handouts, self care instructions, and instructed in symptomatic relief.  He is to avoid loud noises.  3.  Follow up:  The patient was told to follow up if no better in 3 to 4 days, if becoming worse in any way, and given some red flag symptoms such as any new neurological symptoms which would prompt immediate return.  Follow up with an ear nose and throat doctor in [place] as soon as able to see one.     Reuben Likes, MD 11/05/13 [phone]

## 2013-11-05 NOTE — ED Notes (Signed)
Hearing loss

## 2018-08-06 ENCOUNTER — Inpatient Hospital Stay (HOSPITAL_COMMUNITY)
Admission: EM | Admit: 2018-08-06 | Discharge: 2018-08-09 | DRG: 100 | Disposition: A | Discharge status: Home Health | Payer: Medicare Other | Attending: Internal Medicine | Admitting: Internal Medicine

## 2018-08-06 ENCOUNTER — Observation Stay (HOSPITAL_COMMUNITY): Payer: Medicare Other

## 2018-08-06 ENCOUNTER — Emergency Department (HOSPITAL_COMMUNITY): Payer: Medicare Other

## 2018-08-06 ENCOUNTER — Encounter (HOSPITAL_COMMUNITY): Payer: Self-pay | Admitting: Internal Medicine

## 2018-08-06 DIAGNOSIS — E1122 Type 2 diabetes mellitus with diabetic chronic kidney disease: Secondary | ICD-10-CM | POA: Diagnosis present

## 2018-08-06 DIAGNOSIS — G459 Transient cerebral ischemic attack, unspecified: Secondary | ICD-10-CM | POA: Diagnosis not present

## 2018-08-06 DIAGNOSIS — N189 Chronic kidney disease, unspecified: Secondary | ICD-10-CM

## 2018-08-06 DIAGNOSIS — G9340 Encephalopathy, unspecified: Secondary | ICD-10-CM | POA: Diagnosis not present

## 2018-08-06 DIAGNOSIS — Z794 Long term (current) use of insulin: Secondary | ICD-10-CM

## 2018-08-06 DIAGNOSIS — R569 Unspecified convulsions: Principal | ICD-10-CM | POA: Diagnosis present

## 2018-08-06 DIAGNOSIS — I129 Hypertensive chronic kidney disease with stage 1 through stage 4 chronic kidney disease, or unspecified chronic kidney disease: Secondary | ICD-10-CM | POA: Diagnosis present

## 2018-08-06 DIAGNOSIS — Z8673 Personal history of transient ischemic attack (TIA), and cerebral infarction without residual deficits: Secondary | ICD-10-CM

## 2018-08-06 DIAGNOSIS — R109 Unspecified abdominal pain: Secondary | ICD-10-CM | POA: Diagnosis present

## 2018-08-06 DIAGNOSIS — N183 Chronic kidney disease, stage 3 (moderate): Secondary | ICD-10-CM | POA: Diagnosis present

## 2018-08-06 DIAGNOSIS — R001 Bradycardia, unspecified: Secondary | ICD-10-CM | POA: Diagnosis present

## 2018-08-06 DIAGNOSIS — Z7982 Long term (current) use of aspirin: Secondary | ICD-10-CM

## 2018-08-06 DIAGNOSIS — G9389 Other specified disorders of brain: Secondary | ICD-10-CM | POA: Diagnosis present

## 2018-08-06 DIAGNOSIS — R32 Unspecified urinary incontinence: Secondary | ICD-10-CM | POA: Diagnosis present

## 2018-08-06 DIAGNOSIS — D649 Anemia, unspecified: Secondary | ICD-10-CM | POA: Diagnosis present

## 2018-08-06 DIAGNOSIS — Z79899 Other long term (current) drug therapy: Secondary | ICD-10-CM

## 2018-08-06 DIAGNOSIS — R471 Dysarthria and anarthria: Secondary | ICD-10-CM | POA: Diagnosis present

## 2018-08-06 DIAGNOSIS — I161 Hypertensive emergency: Secondary | ICD-10-CM | POA: Diagnosis present

## 2018-08-06 DIAGNOSIS — R319 Hematuria, unspecified: Secondary | ICD-10-CM | POA: Diagnosis not present

## 2018-08-06 DIAGNOSIS — F0390 Unspecified dementia without behavioral disturbance: Secondary | ICD-10-CM | POA: Diagnosis present

## 2018-08-06 DIAGNOSIS — E876 Hypokalemia: Secondary | ICD-10-CM | POA: Diagnosis present

## 2018-08-06 DIAGNOSIS — I6381 Other cerebral infarction due to occlusion or stenosis of small artery: Secondary | ICD-10-CM | POA: Diagnosis present

## 2018-08-06 DIAGNOSIS — E785 Hyperlipidemia, unspecified: Secondary | ICD-10-CM | POA: Diagnosis present

## 2018-08-06 DIAGNOSIS — Z7902 Long term (current) use of antithrombotics/antiplatelets: Secondary | ICD-10-CM

## 2018-08-06 DIAGNOSIS — I639 Cerebral infarction, unspecified: Secondary | ICD-10-CM | POA: Diagnosis not present

## 2018-08-06 DIAGNOSIS — I6523 Occlusion and stenosis of bilateral carotid arteries: Secondary | ICD-10-CM | POA: Diagnosis present

## 2018-08-06 DIAGNOSIS — I272 Pulmonary hypertension, unspecified: Secondary | ICD-10-CM | POA: Diagnosis present

## 2018-08-06 DIAGNOSIS — I2510 Atherosclerotic heart disease of native coronary artery without angina pectoris: Secondary | ICD-10-CM | POA: Diagnosis not present

## 2018-08-06 DIAGNOSIS — N179 Acute kidney failure, unspecified: Secondary | ICD-10-CM | POA: Diagnosis present

## 2018-08-06 DIAGNOSIS — Z8546 Personal history of malignant neoplasm of prostate: Secondary | ICD-10-CM

## 2018-08-06 DIAGNOSIS — E119 Type 2 diabetes mellitus without complications: Secondary | ICD-10-CM

## 2018-08-06 DIAGNOSIS — I672 Cerebral atherosclerosis: Secondary | ICD-10-CM | POA: Diagnosis present

## 2018-08-06 DIAGNOSIS — E118 Type 2 diabetes mellitus with unspecified complications: Secondary | ICD-10-CM

## 2018-08-06 DIAGNOSIS — R29704 NIHSS score 4: Secondary | ICD-10-CM | POA: Diagnosis present

## 2018-08-06 DIAGNOSIS — I674 Hypertensive encephalopathy: Secondary | ICD-10-CM | POA: Diagnosis present

## 2018-08-06 DIAGNOSIS — Z7901 Long term (current) use of anticoagulants: Secondary | ICD-10-CM

## 2018-08-06 LAB — COMPREHENSIVE METABOLIC PANEL
ALK PHOS: 112 U/L (ref 38–126)
ALT: 11 U/L (ref 0–44)
ANION GAP: 9 (ref 5–15)
AST: 20 U/L (ref 15–41)
Albumin: 3.2 g/dL — ABNORMAL LOW (ref 3.5–5.0)
BILIRUBIN TOTAL: 0.7 mg/dL (ref 0.3–1.2)
BUN: 21 mg/dL (ref 8–23)
CALCIUM: 9 mg/dL (ref 8.9–10.3)
CO2: 24 mmol/L (ref 22–32)
Chloride: 109 mmol/L (ref 98–111)
Creatinine, Ser: 2.3 mg/dL — ABNORMAL HIGH (ref 0.61–1.24)
GFR, EST AFRICAN AMERICAN: 30 mL/min — AB (ref >60)
GFR, EST NON AFRICAN AMERICAN: 26 mL/min — AB (ref >60)
Glucose, Bld: 98 mg/dL (ref 70–99)
Potassium: 3.6 mmol/L (ref 3.5–5.1)
Sodium: 142 mmol/L (ref 135–145)
TOTAL PROTEIN: 7.4 g/dL (ref 6.5–8.1)

## 2018-08-06 LAB — I-STAT CHEM 8, ED
BUN: 24 mg/dL — ABNORMAL HIGH (ref 8–23)
CHLORIDE: 108 mmol/L (ref 98–111)
CREATININE: 2.3 mg/dL — AB (ref 0.61–1.24)
Calcium, Ion: 1.14 mmol/L — ABNORMAL LOW (ref 1.15–1.40)
Glucose, Bld: 93 mg/dL (ref 70–99)
HCT: 37 % — ABNORMAL LOW (ref 39.0–52.0)
HEMOGLOBIN: 12.6 g/dL — AB (ref 13.0–17.0)
Potassium: 3.5 mmol/L (ref 3.5–5.1)
Sodium: 143 mmol/L (ref 135–145)
TCO2: 24 mmol/L (ref 22–32)

## 2018-08-06 LAB — CBC
HEMATOCRIT: 37.1 % — AB (ref 39.0–52.0)
Hemoglobin: 12.3 g/dL — ABNORMAL LOW (ref 13.0–17.0)
MCH: 29.7 pg (ref 26.0–34.0)
MCHC: 33.2 g/dL (ref 30.0–36.0)
MCV: 89.6 fL (ref 78.0–100.0)
Platelets: 244 10*3/uL (ref 150–400)
RBC: 4.14 MIL/uL — ABNORMAL LOW (ref 4.22–5.81)
RDW: 14.5 % (ref 11.5–15.5)
WBC: 7.9 10*3/uL (ref 4.0–10.5)

## 2018-08-06 LAB — URINALYSIS, ROUTINE W REFLEX MICROSCOPIC
BACTERIA UA: NONE SEEN
BILIRUBIN URINE: NEGATIVE
Glucose, UA: NEGATIVE mg/dL
Hgb urine dipstick: NEGATIVE
KETONES UR: NEGATIVE mg/dL
LEUKOCYTES UA: NEGATIVE
NITRITE: NEGATIVE
PH: 6 (ref 5.0–8.0)
PROTEIN: 100 mg/dL — AB
Specific Gravity, Urine: 1.009 (ref 1.005–1.030)

## 2018-08-06 LAB — PROTIME-INR
INR: 1.05
Prothrombin Time: 13.6 seconds (ref 11.4–15.2)

## 2018-08-06 LAB — DIFFERENTIAL
Abs Immature Granulocytes: 0 10*3/uL (ref 0.0–0.1)
BASOS ABS: 0 10*3/uL (ref 0.0–0.1)
Basophils Relative: 1 %
EOS PCT: 3 %
Eosinophils Absolute: 0.3 10*3/uL (ref 0.0–0.7)
Immature Granulocytes: 0 %
LYMPHS PCT: 49 %
Lymphs Abs: 3.8 10*3/uL (ref 0.7–4.0)
MONO ABS: 0.7 10*3/uL (ref 0.1–1.0)
MONOS PCT: 8 %
NEUTROS ABS: 3 10*3/uL (ref 1.7–7.7)
Neutrophils Relative %: 39 %

## 2018-08-06 LAB — ETHANOL

## 2018-08-06 LAB — APTT: APTT: 36 s (ref 24–36)

## 2018-08-06 LAB — RAPID URINE DRUG SCREEN, HOSP PERFORMED
AMPHETAMINES: NOT DETECTED
Barbiturates: NOT DETECTED
Benzodiazepines: NOT DETECTED
Cocaine: NOT DETECTED
OPIATES: NOT DETECTED
Tetrahydrocannabinol: NOT DETECTED

## 2018-08-06 LAB — CBG MONITORING, ED
GLUCOSE-CAPILLARY: 79 mg/dL (ref 70–99)
Glucose-Capillary: 93 mg/dL (ref 70–99)

## 2018-08-06 LAB — I-STAT TROPONIN, ED: TROPONIN I, POC: 0.05 ng/mL (ref 0.00–0.08)

## 2018-08-06 MED ORDER — SODIUM CHLORIDE 0.9 % IV BOLUS
1000.0000 mL | Freq: Once | INTRAVENOUS | Status: AC
  Administered 2018-08-06: 1000 mL via INTRAVENOUS

## 2018-08-06 MED ORDER — SODIUM CHLORIDE 0.9 % IV SOLN
INTRAVENOUS | Status: DC
  Administered 2018-08-06: 1000 mL via INTRAVENOUS
  Administered 2018-08-08 (×2): via INTRAVENOUS

## 2018-08-06 MED ORDER — HYDRALAZINE HCL 20 MG/ML IJ SOLN: 10.0000 mg | INTRAMUSCULAR | Status: DC | PRN

## 2018-08-06 MED ORDER — LORAZEPAM 2 MG/ML IJ SOLN: 1.0000 mg | INTRAMUSCULAR | Status: DC | PRN

## 2018-08-06 MED ORDER — ATORVASTATIN CALCIUM 80 MG PO TABS
80.0000 mg | ORAL_TABLET | Freq: Every day | ORAL | Status: DC
  Administered 2018-08-07 – 2018-08-08 (×3): 80 mg via ORAL
  Filled 2018-08-06 (×3): qty 1

## 2018-08-06 MED ORDER — FAMOTIDINE IN NACL 20-0.9 MG/50ML-% IV SOLN
20.0000 mg | Freq: Once | INTRAVENOUS | Status: AC
  Administered 2018-08-07: 20 mg via INTRAVENOUS
  Filled 2018-08-06: qty 50

## 2018-08-06 MED ORDER — ACETAMINOPHEN 325 MG PO TABS: 650.0000 mg | ORAL_TABLET | ORAL | Status: DC | PRN

## 2018-08-06 MED ORDER — LEVETIRACETAM IN NACL 1000 MG/100ML IV SOLN
1000.0000 mg | Freq: Once | INTRAVENOUS | Status: AC
  Administered 2018-08-06: 1000 mg via INTRAVENOUS
  Filled 2018-08-06: qty 100

## 2018-08-06 MED ORDER — INSULIN ASPART 100 UNIT/ML ~~LOC~~ SOLN
0.0000 [IU] | Freq: Three times a day (TID) | SUBCUTANEOUS | Status: DC
  Administered 2018-08-07: 2 [IU] via SUBCUTANEOUS
  Administered 2018-08-08: 1 [IU] via SUBCUTANEOUS
  Administered 2018-08-08 – 2018-08-09 (×3): 2 [IU] via SUBCUTANEOUS

## 2018-08-06 MED ORDER — ACETAMINOPHEN 160 MG/5ML PO SOLN: 650.0000 mg | ORAL | Status: DC | PRN

## 2018-08-06 MED ORDER — INSULIN ASPART 100 UNIT/ML ~~LOC~~ SOLN: 0.0000 [IU] | Freq: Every day | SUBCUTANEOUS | Status: DC

## 2018-08-06 MED ORDER — STROKE: EARLY STAGES OF RECOVERY BOOK
Freq: Once | Status: AC
  Administered 2018-08-06
  Filled 2018-08-06: qty 1

## 2018-08-06 MED ORDER — HEPARIN SODIUM (PORCINE) 5000 UNIT/ML IJ SOLN
5000.0000 [IU] | Freq: Three times a day (TID) | INTRAMUSCULAR | Status: DC
  Administered 2018-08-07 – 2018-08-09 (×9): 5000 [IU] via SUBCUTANEOUS
  Filled 2018-08-06 (×9): qty 1

## 2018-08-06 MED ORDER — ASPIRIN 300 MG RE SUPP: 300.0000 mg | Freq: Every day | RECTAL | Status: DC

## 2018-08-06 MED ORDER — ACETAMINOPHEN 650 MG RE SUPP: 650.0000 mg | RECTAL | Status: DC | PRN

## 2018-08-06 MED ORDER — ASPIRIN 325 MG PO TABS
325.0000 mg | ORAL_TABLET | Freq: Every day | ORAL | Status: DC
  Administered 2018-08-07: 325 mg via ORAL
  Filled 2018-08-06: qty 1

## 2018-08-06 MED ORDER — IPRATROPIUM-ALBUTEROL 0.5-2.5 (3) MG/3ML IN SOLN: 3.0000 mL | RESPIRATORY_TRACT | Status: DC | PRN

## 2018-08-06 NOTE — Code Documentation (Signed)
77 year old male presents to [facility] via [facility] as code stroke which was called in the field.  EMS reports daughter called EMS due to patient having tremors and not speaking.  He was normal at 3pm after getting up from a nap - at 1715 he was trembling and unable to speak.  EMS reports confusion and slurred speech.  On arrival he is awake - hot to touch - follows some commands - able to name some objects - but otherwise repeats himself - speech is slightly dysarthric.  Moves all extremities.  Met at bridge by stroke team - to CT scan.  Dr. Lorraine Lax at bedside. No acute stroke treatment at this time.  Previous medical records show patient on Keppra - unclear at this point if he is taking it.  Dr. Darl Householder at bedside.  Handoff to [employer].  To repage code stroke if patient worsens.

## 2018-08-06 NOTE — ED Triage Notes (Signed)
Pt here from home after waking up from a nap and developing stroke like symptoms. Per daughter, pt started napping at 1pm, woke up at 3pm, and at 5:20pm, the patient developed tremor and was unable to answer daughter's questions. Daughter thought that the patient was hypoglycemic and called EMS. CBG 93 for this RN. Upon EMS arrival, pt unable to follow commands, tachycardic, and extremely hypertensive.

## 2018-08-06 NOTE — H&P (Addendum)
History and Physical    Mathew Garcia NTI:[id number] DOB: 09/27/1941 DOA: 08/06/2018  Referring MD/NP/PA: Shirlyn Goltz, MD PCP: Patient, No Pcp Per  Patient coming from: Home via EMS  Chief Complaint: Altered  I have personally briefly reviewed patient's old medical records in [place]   HPI: Kishan Wachsmuth Goyer is a 77 y.o. male with medical history significant of HTN, HLD, DM type II, prostate cancer; who presents after being found acutely altered.  History is obtained from the patient's daughter who is present at bedside.  He has been here visiting from [facility] where his primary care provider is Dr. Nyoka Cowden.  Patient reportedly last seen normal around 1 PM and after waking up sometime around 3 PM was reportedly altered.  However, daughter gone to pick up children and then came back around 520 who found him tremulous and staring off into space.  There was reports of urinary incontinence, but no seizure activity appreciated.  EMS was called as family was concerned for low blood sugars but initial CBG 93 per EMS.  It is unclear if the patient has been taking his medications including Keppra that is on his medication list.  Daughter is unsure what medications he is currently supposed to be taking.  At this time he only complains of abdominal pain.  ED Course: Patient was seen as a code stroke by Dr. Lorraine Lax.  Initial CT scan showed no acute hemorrhage and chronic left frontal encephalomalacia.  Patient was noted to be afebrile, blood pressure elevated up to 213/112, heart rates 47-49, and all other vital signs maintained.   Labs revealed normal CBC, BUN 21, creatinine 2.3, and INR 1.05.  Alcohol level was undetectable and urine drug screen was pending.  Patient was given 1 L of normal saline IV fluids and 1000 mg of Keppra IV.  TRH called to admit concerning seizure versus hypertensive emergency.  Review of Systems  Unable to perform ROS: Mental status change  Gastrointestinal:  Positive for abdominal pain.    Past Medical History:  Diagnosis Date  . Diabetes mellitus without complication ([place])   . Heart murmur   . Hypertension   . Prostate cancer [facility])     Past Surgical History:  Procedure Laterality Date  . PROSTATE SURGERY       reports that he has never smoked. He has never used smokeless tobacco. He reports that he does not drink alcohol or use drugs.  No Known Allergies  Family History  Problem Relation Age of Onset  . Diabetes Mother   . Hypertension Mother     Prior to Admission medications   Medication Sig Start Date End Date Taking? Authorizing Provider  aspirin 81 MG chewable tablet Chew 81 mg by mouth daily.   Yes [provider]  donepezil (ARICEPT) 10 MG tablet Take 10 mg by mouth at bedtime.   Yes [provider]  hydrALAZINE (APRESOLINE) 100 MG tablet Take 100 mg by mouth 2 (two) times daily.   Yes [provider]  insulin NPH-regular Human (NOVOLIN 70/30) (70-30) 100 UNIT/ML injection Inject 10-20 Units into the skin daily with breakfast.   Yes [provider]  levETIRAcetam (KEPPRA) 750 MG tablet Take 1,500 mg by mouth daily.   Yes [provider]  amLODipine (NORVASC) 10 MG tablet Take 10 mg by mouth every evening.    [provider]  carvedilol (COREG) 6.25 MG tablet Take 1 tablet (6.25 mg total) by mouth 2 (two) times daily with  a meal. 02/04/13   Hongalgi, Lenis Dickinson, MD  glipiZIDE (GLUCOTROL) 10 MG tablet Take 10 mg by mouth 2 (two) times daily before a meal.    [provider]  simvastatin (ZOCOR) 20 MG tablet Take 20 mg by mouth every evening.    [provider]  warfarin (COUMADIN) 5 MG tablet Take 1 tablet (5 mg total) by mouth every Monday,Wednesday,Friday, and Sunday at 6 PM. 02/05/13   Modena Jansky, MD  warfarin (COUMADIN) 7.5 MG tablet Take 1 tablet (7.5 mg total) by mouth every Tuesday, Thursday, and Saturday at 6 PM. 02/06/13   Modena Jansky, MD      Physical Exam:  Constitutional: NAD, calm, comfortable and able to follow simple commands. Vitals:   08/06/18 1856 08/06/18 1859 08/06/18 1902 08/06/18 1915  BP:  (!) 213/112 (!) 205/86 (!) 204/83  Pulse:  (!) 48 (!) 49 (!) 47  Resp:  14 12 15   Temp:      TempSrc:      SpO2:  100% 100% 100%  Weight: 80.7 kg      Eyes: PERRL, lids and conjunctivae normal ENMT: Mucous membranes are moist. Posterior pharynx clear of any exudate or lesions.Normal dentition.  Neck: normal, supple, no masses, no thyromegaly Respiratory: clear to auscultation bilaterally, no wheezing, no crackles. Normal respiratory effort. No accessory muscle use.  Cardiovascular: Regular rate and rhythm, no murmurs / rubs / gallops. No extremity edema. 2+ pedal pulses. No carotid bruits.  Abdomen: no tenderness, no masses palpated. No hepatosplenomegaly. Bowel sounds positive.  Musculoskeletal: no clubbing / cyanosis. No joint deformity upper and lower extremities. Good ROM, no contractures. Normal muscle tone.  Skin: no rashes, lesions, ulcers. No induration Neurologic: CN 2-12 grossly intact.  Patient able to move all extremities with strength 5/5 in all extremities.  Some mild expressive aphasia noted Psychiatric: Altered oriented only to person, but not place or time.   Labs on Admission: I have personally reviewed following labs and imaging studies  CBC: Recent Labs  Lab 08/06/18 1813 08/06/18 1816  WBC 7.9  --   NEUTROABS 3.0  --   HGB 12.3* 12.6*  HCT 37.1* 37.0*  MCV 89.6  --   PLT 244  --    Basic Metabolic Panel: Recent Labs  Lab 08/06/18 1813 08/06/18 1816  NA 142 143  K 3.6 3.5  CL 109 108  CO2 24  --   GLUCOSE 98 93  BUN 21 24*  CREATININE 2.30* 2.30*  CALCIUM 9.0  --    GFR: CrCl cannot be calculated (Unknown ideal weight.). Liver Function Tests: Recent Labs  Lab 08/06/18 1813  AST 20  ALT 11  ALKPHOS 112  BILITOT 0.7  PROT 7.4  ALBUMIN 3.2*   No results for input(s):  LIPASE, AMYLASE in the last 168 hours. No results for input(s): AMMONIA in the last 168 hours. Coagulation Profile: Recent Labs  Lab 08/06/18 1813  INR 1.05   Cardiac Enzymes: No results for input(s): CKTOTAL, CKMB, CKMBINDEX, TROPONINI in the last 168 hours. BNP (last 3 results) No results for input(s): PROBNP in the last 8760 hours. HbA1C: No results for input(s): HGBA1C in the last 72 hours. CBG: Recent Labs  Lab 08/06/18 1810  GLUCAP 93   Lipid Profile: No results for input(s): CHOL, HDL, LDLCALC, TRIG, CHOLHDL, LDLDIRECT in the last 72 hours. Thyroid Function Tests: No results for input(s): TSH, T4TOTAL, FREET4, T3FREE, THYROIDAB in the last 72 hours. Anemia Panel: No results for input(s):  VITAMINB12, FOLATE, FERRITIN, TIBC, IRON, RETICCTPCT in the last 72 hours. Urine analysis:    Component Value Date/Time   COLORURINE YELLOW 01/31/2013 0320   APPEARANCEUR CLEAR 01/31/2013 0320   LABSPEC 1.013 01/31/2013 0320   PHURINE 6.5 01/31/2013 0320   GLUCOSEU 100 (A) 01/31/2013 0320   HGBUR MODERATE (A) 01/31/2013 0320   BILIRUBINUR NEGATIVE 01/31/2013 0320   KETONESUR NEGATIVE 01/31/2013 0320   PROTEINUR 100 (A) 01/31/2013 0320   UROBILINOGEN 0.2 01/31/2013 0320   NITRITE NEGATIVE 01/31/2013 0320   LEUKOCYTESUR NEGATIVE 01/31/2013 0320   Sepsis Labs: No results found for this or any previous visit (from the past 240 hour(s)).   Radiological Exams on Admission: Ct Head Wo Contrast  Result Date: 08/06/2018 CLINICAL DATA:  Confusion with slurred speech. Last seen normal 5:15 p.m. EXAM: CT HEAD WITHOUT CONTRAST TECHNIQUE: Contiguous axial images were obtained from the base of the skull through the vertex without intravenous contrast. COMPARISON:  Head CT 05/02/2010 FINDINGS: Brain: There is no mass, hemorrhage or extra-axial collection. The size and configuration of the ventricles and extra-axial CSF spaces are normal. Encephalomalacia of the anterior inferior left frontal  lobe multifocal calcification. Ischemic Vascular: No abnormal hyperdensity of the major intracranial arteries or dural venous sinuses. No intracranial atherosclerosis. Skull: The visualized skull base, calvarium and extracranial soft tissues are normal. Sinuses/Orbits: Diffuse nasal polypoid soft tissue thickening with complete opacification of both frontal sinuses. The orbits are normal. ASPECTS ([place] Stroke Program Early CT Score) - Ganglionic level infarction (caudate, lentiform nuclei, internal capsule, insula, M1-M3 cortex): 7 - Supraganglionic infarction (M4-M6 cortex): 3 Total score (0-10 with 10 being normal): 10 Review of the MIP images confirms the above findings IMPRESSION: 1. No acute hemorrhage.  Chronic left frontal encephalomalacia. 2. ASPECTS is 10. These results were communicated to Dr. Karena Addison Aroor at 6:28 pm on 08/06/2018 by text page via the [facility] messaging system. Electronically Signed   By: Ulyses Jarred M.D.   On: 08/06/2018 18:30   Dg Chest Port 1 View  Result Date: 08/06/2018 CLINICAL DATA:  Altered mental status EXAM: PORTABLE CHEST 1 VIEW COMPARISON:  Chest CT 02/01/2013. FINDINGS: Mild cardiomegaly. No confluent airspace opacities, effusions or edema. No acute bony abnormality. IMPRESSION: Mild cardiomegaly.  No active disease. Electronically Signed   By: Rolm Baptise M.D.   On: 08/06/2018 19:25    EKG: Independently reviewed.  Sinus bradycardia 48 bpm  Assessment/Plan Seizure and/or CVA : Acute.  Patient presents acutely altered with inability to speak and reported loss of urine.  Blood pressure was noted to be elevated up to 213/112.  Neurology consulted concerning for hypertensive urgency vs. Seizure vs. stroke.  CT scan initially showed no acute hemorrhage or acute abnormality.  MRI of the brain pending. - Admit to a stepdown bed - Neurochecks - Seizure precautions - Follow-up urine drug screen - Check hemoglobin A1c and lipid panel in a.m. - Add on TSH - Checking  MRI of the brain - Blood pressure control/permissive hypertension based off MRI - PT/OT/Speech therapy to evaluate and treat - ASA and atorvastatin - Continue Keppra per neuro recommendations - Appreciate neurology consultative services will follow-up for further recommendations  Hypertensive emergency: Initial blood pressures noted to be elevated into the 200s.  Patient does not appear to be taking any blood pressure medications. - Restart hydralazine - Held Coreg due to bradycardia  Bradycardia: Initial heart rates 47-49 on admission.  Blood pressures currently elevated as seen above. - Hold rate controlling medications.  Suspected  acute kidney injury on chronic kidney disease: Patient presents with a creatinine of 2.3 on admission with BUN 21.  Last available creatinine is from 2014 around 1.9.  This could be patient's baseline given elevated blood pressures.  Patient was given 1 L of normal saline IV fluids in the ED. - Follow-up urinalysis - Gentle IV fluids - Recheck kidney function in a.m.  Diabetes mellitus type II: Blood sugars noted to be around 135.  Patient reportedly on glipizide unclear patient has been taking this medicine. - Hypoglycemic protocols - Hold glipizide - CBGs q. before meals and at bedtime with sensitive SSI  CAD: Last noted cardiac cath in Oct 2004 showed 50% proximal RCA lesion followed by a 40% LAD lesion - Diagonal #1 had sequential 30 and 20% lesions - Circumflex was angiographically normal. 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  Duration of anticoagulation previously undefined looking at notes from 2014. - Will need to discuss with neurology regarding   History of prostate cancer   DVT prophylaxis: heparin Code Status: Full Family Communication: No family present at bedside Disposition Plan: To  be determined Consults called: Neurology Admission status: observation  Norval Morton MD [facility] Pager [phone]   If 7PM-7AM, please contact night-coverage [email or web address] Password [facility]  08/06/2018, 8:22 PM

## 2018-08-06 NOTE — ED Notes (Signed)
Seizure pads placed

## 2018-08-06 NOTE — ED Provider Notes (Signed)
[place] EMERGENCY DEPARTMENT Provider Note   CSN: [id number] Arrival date & time: 08/06/18  0086   An emergency department physician performed an initial assessment on this suspected stroke patient at 1812.  History   Chief Complaint Chief Complaint  Patient presents with  . Code Stroke    HPI Qusai L Sarli is a 77 y.o. male hx of DM, HTN, here with AMS. Patient's last normal was around 1 pm. He went for a nap and woke up around 3 pm. Daughter states that he appears altered around 3 pm when he woke up. She then went to pick up her kids and then came back around 5:20 pm. He appeared very altered and had some tremors and stared into space. Possible incontinence at that time but no actual seizure activities observed.  Daughter thought that patient was hypoglycemic and called EMS.  CBG was 93 per EMS.  Patient was noted to be very hypertensive and code stroke was activated by EMS.  Neurology saw patient on arrival.  The history is provided by the patient.   Level V caveat- AMS   Past Medical History:  Diagnosis Date  . Diabetes mellitus without complication ([place])   . Heart murmur   . Hypertension   . Prostate cancer [facility])     Patient Active Problem List   Diagnosis Date Noted  . Pulmonary nodules 02/03/2013  . CKD (chronic kidney disease), stage III ([place]) 02/03/2013  . Vertebral artery dissection ([place]) 01/31/2013  . HTN (hypertension) 01/31/2013  . DM (diabetes mellitus) ([place]) 01/31/2013  . Heart murmur 01/31/2013  . Hyperlipidemia 01/31/2013    Past Surgical History:  Procedure Laterality Date  . PROSTATE SURGERY          Home Medications    Prior to Admission medications   Medication Sig Start Date End Date Taking? Authorizing Provider  aspirin 81 MG chewable tablet Chew 81 mg by mouth daily.   Yes [provider]  donepezil (ARICEPT) 10 MG tablet Take 10 mg by mouth at bedtime.   Yes [provider]  hydrALAZINE  (APRESOLINE) 100 MG tablet Take 100 mg by mouth 2 (two) times daily.   Yes [provider]  insulin NPH-regular Human (NOVOLIN 70/30) (70-30) 100 UNIT/ML injection Inject 10-20 Units into the skin daily with breakfast.   Yes [provider]  levETIRAcetam (KEPPRA) 750 MG tablet Take 1,500 mg by mouth daily.   Yes [provider]  amLODipine (NORVASC) 10 MG tablet Take 10 mg by mouth every evening.    [provider]  carvedilol (COREG) 6.25 MG tablet Take 1 tablet (6.25 mg total) by mouth 2 (two) times daily with a meal. Patient not taking: Reported on 08/06/2018 02/04/13   Modena Jansky, MD  clopidogrel (PLAVIX) 75 MG tablet Take 75 mg by mouth daily.    [provider]  glipiZIDE (GLUCOTROL XL) 10 MG 24 hr tablet Take 10 mg by mouth 2 (two) times daily before a meal.    [provider]  metoprolol tartrate (LOPRESSOR) 100 MG tablet Take 100 mg by mouth 2 (two) times daily.    [provider]  simvastatin (ZOCOR) 20 MG tablet Take 20 mg by mouth every evening.    [provider]  warfarin (COUMADIN) 5 MG tablet Take 1 tablet (5 mg total) by mouth every Monday,Wednesday,Friday, and Sunday at 6 PM. Patient not taking: Reported on 08/06/2018 02/05/13   Modena Jansky, MD  warfarin (COUMADIN) 7.5 MG  tablet Take 1 tablet (7.5 mg total) by mouth every Tuesday, Thursday, and Saturday at 6 PM. Patient not taking: Reported on 08/06/2018 02/06/13   Modena Jansky, MD    Family History Family History  Problem Relation Age of Onset  . Diabetes Mother   . Hypertension Mother     Social History Social History   Tobacco Use  . Smoking status: Never Smoker  . Smokeless tobacco: Never Used  Substance Use Topics  . Alcohol use: No  . Drug use: No     Allergies   Patient has no known allergies.   Review of Systems Review of Systems  Neurological: Positive for weakness.  All other systems reviewed and are  negative.    Physical Exam Updated Vital Signs BP (!) 204/83 (BP Location: Right Arm)   Pulse (!) 47   Temp (S) 97.6 F (36.4 C) (Rectal)   Resp 15   Wt 80.7 kg   SpO2 100%   BMI 29.61 kg/m   Physical Exam  Constitutional:  Chronically ill   HENT:  Head: Normocephalic.  Mouth/Throat: Oropharynx is clear and moist.  Eyes: Pupils are equal, round, and reactive to light. Conjunctivae and EOM are normal.  Neck: Normal range of motion. Neck supple.  Cardiovascular: Normal rate, regular rhythm, normal heart sounds and intact distal pulses.  Pulmonary/Chest: Effort normal and breath sounds normal. No stridor. No respiratory distress. He has no wheezes.  Abdominal: Soft. Bowel sounds are normal. He exhibits no distension. There is no tenderness.  Musculoskeletal: Normal range of motion.  Neurological:  Confused. CN 2- 12 intact. Moving all extremities   Skin: Skin is warm. Capillary refill takes less than 2 seconds.  Psychiatric: He has a normal mood and affect.  Nursing note and vitals reviewed.    ED Treatments / Results  Labs (all labs ordered are listed, but only abnormal results are displayed) Labs Reviewed  CBC - Abnormal; Notable for the following components:      Result Value   RBC 4.14 (*)    Hemoglobin 12.3 (*)    HCT 37.1 (*)    All other components within normal limits  COMPREHENSIVE METABOLIC PANEL - Abnormal; Notable for the following components:   Creatinine, Ser 2.30 (*)    Albumin 3.2 (*)    GFR calc non Af Amer 26 (*)    GFR calc Af Amer 30 (*)    All other components within normal limits  I-STAT CHEM 8, ED - Abnormal; Notable for the following components:   BUN 24 (*)    Creatinine, Ser 2.30 (*)    Calcium, Ion 1.14 (*)    Hemoglobin 12.6 (*)    HCT 37.0 (*)    All other components within normal limits  ETHANOL  PROTIME-INR  APTT  DIFFERENTIAL  RAPID URINE DRUG SCREEN, HOSP PERFORMED  URINALYSIS, ROUTINE W REFLEX MICROSCOPIC  I-STAT CHEM 8,  ED  I-STAT TROPONIN, ED  CBG MONITORING, ED    EKG None  Radiology Ct Head Wo Contrast  Result Date: 08/06/2018 CLINICAL DATA:  Confusion with slurred speech. Last seen normal 5:15 p.m. EXAM: CT HEAD WITHOUT CONTRAST TECHNIQUE: Contiguous axial images were obtained from the base of the skull through the vertex without intravenous contrast. COMPARISON:  Head CT 05/02/2010 FINDINGS: Brain: There is no mass, hemorrhage or extra-axial collection. The size and configuration of the ventricles and extra-axial CSF spaces are normal. Encephalomalacia of the anterior inferior left frontal lobe multifocal calcification. Ischemic Vascular: No abnormal  hyperdensity of the major intracranial arteries or dural venous sinuses. No intracranial atherosclerosis. Skull: The visualized skull base, calvarium and extracranial soft tissues are normal. Sinuses/Orbits: Diffuse nasal polypoid soft tissue thickening with complete opacification of both frontal sinuses. The orbits are normal. ASPECTS ([place] Stroke Program Early CT Score) - Ganglionic level infarction (caudate, lentiform nuclei, internal capsule, insula, M1-M3 cortex): 7 - Supraganglionic infarction (M4-M6 cortex): 3 Total score (0-10 with 10 being normal): 10 Review of the MIP images confirms the above findings IMPRESSION: 1. No acute hemorrhage.  Chronic left frontal encephalomalacia. 2. ASPECTS is 10. These results were communicated to Dr. Karena Addison Aroor at 6:28 pm on 08/06/2018 by text page via the [facility] messaging system. Electronically Signed   By: Ulyses Jarred M.D.   On: 08/06/2018 18:30   Dg Chest Port 1 View  Result Date: 08/06/2018 CLINICAL DATA:  Altered mental status EXAM: PORTABLE CHEST 1 VIEW COMPARISON:  Chest CT 02/01/2013. FINDINGS: Mild cardiomegaly. No confluent airspace opacities, effusions or edema. No acute bony abnormality. IMPRESSION: Mild cardiomegaly.  No active disease. Electronically Signed   By: Rolm Baptise M.D.   On: 08/06/2018 19:25     Procedures Procedures (including critical care time)  Medications Ordered in ED Medications  levETIRAcetam (KEPPRA) IVPB 1000 mg/100 mL premix (0 mg Intravenous Stopped 08/06/18 1947)  sodium chloride 0.9 % bolus 1,000 mL (1,000 mLs Intravenous New Bag/Given 08/06/18 1932)     Initial Impression / Assessment and Plan / ED Course  I have reviewed the triage vital signs and the nursing notes.  Pertinent labs & imaging results that were available during my care of the patient were reviewed by me and considered in my medical decision making (see chart for details).     XZAIVER VAYDA is a 77 y.o. male here with weakness, AMS. Likely seizure vs stroke. Code stroke activated by EMS. Dr. Lorraine Lax at bedside. Will get CT head, stroke labs.   7 pm CT head unremarkable. Dr. Lorraine Lax recommend admission for stroke vs seizure workup. Given keppra 1000 mg IV in the ED.   8:51 PM Labs showed mild AKI and give IVF. Hypertensive 204/83. Dr. Lorraine Lax wants to observe for now and if MRI brain showed stroke, then need permissive hypertension but if it doesn't show stroke, can give BP meds to treat hypertension.    Final Clinical Impressions(s) / ED Diagnoses   Final diagnoses:  TIA (transient ischemic attack)    ED Discharge Orders    None       Drenda Freeze, MD 08/06/18 2051

## 2018-08-06 NOTE — ED Notes (Signed)
Daughter Oda Cogan [phone]

## 2018-08-06 NOTE — Consult Note (Addendum)
Requesting Physician: Dr. Darl Householder     Chief Complaint: Confusion,shivering   History obtained from: EMS and Chart     HPI:                                                                                                                                       Mathew Garcia is an 77 y.o. male with past medical history of vertebral dissection, diabetes, hypertension, heart murmur, hyperlipidemia, CKD stage III presents to the emergency room as a stroke alert for sudden onset confusion, shivering.  Patient lives with his daughter.  EMS he is had a nap 1- 3PM was normal waking up.  Around 5:15 PM daughter noticed he had suddenly was not speaking, and was shivering.  She thought his glucose may be low and called EMS.  When EMS arrived, patient appeared confused and would answers questions intermittently.  Speech was slurred.  Glucose was 98.  Blood pressure was extremely high, manually recorded as greater than 300. No focal deficits was appreciated however because of his confusion patient was brought in as a stroke alert.  On arrival, pressure was 353 systolic and ranging upto 614E systolic.  CT Head showed chronic left frontal encephalomalacia but no acute changes.   Date last known well: 8.27.19 Time last known well: 5.15 pm tPA Given: No, mild and resolving symptoms, low suspicion of stroke NIHSS: 4 Baseline MRS 1   Past Medical History:  Diagnosis Date  . Diabetes mellitus without complication   . Heart murmur   . Hypertension   . Prostate cancer     Past Surgical History:  Procedure Laterality Date  . PROSTATE SURGERY      Family History  Problem Relation Age of Onset  . Diabetes Mother   . Hypertension Mother    Social History:  reports that he has never smoked. He has never used smokeless tobacco. He reports that he does not drink alcohol or use drugs.  Allergies: No Known Allergies  Medications:                                                                                                                         I reviewed home medications   ROS:  14 systems reviewed and negative except above   Examination:                                                                                                      General: Appears well-developed and well-nourished.  Psych: Affect appropriate to situation Eyes: No scleral injection HENT: No OP obstrucion Head: Normocephalic.  Cardiovascular: Normal rate and regular rhythm.  Respiratory: Effort normal and breath sounds normal to anterior ascultation GI: Soft.  No distension. There is no tenderness.  Skin: WDI    Neurological Examination Mental Status: Alert to name, states month and age incorrectly. Talks in sentences and answers to simple questions appropriately.  Able to follow simple commands without difficulty. Cranial Nerves: II: Visual fields grossly normal, blinks to threat bilaterally III,IV, VI: ptosis not present, extra-ocular motions intact bilaterally, pupils equal, round, reactive to light and accommodation V,VII: smile symmetric, facial light touch sensation normal bilaterally VIII: hearing normal bilaterally IX,X: uvula rises symmetrically XI: bilateral shoulder shrug XII: midline tongue extension Motor: Right : Upper extremity   5/5    Left:     Upper extremity   5/5  Lower extremity   5/5     Lower extremity   5/5 Tone and bulk:normal tone throughout; no atrophy noted Sensory: Pinprick and light touch intact throughout, bilaterally Deep Tendon Reflexes: 3+ over biceps, patella over right side, 2+ on left side Plantars: Right: downgoing   Left: downgoing Cerebellar: normal finger-to-nose, normal rapid alternating movements and normal heel-to-shin test Gait: did not assess     Lab Results: Basic Metabolic Panel: Recent Labs  Lab 08/06/18 1813  08/06/18 1816  NA 142 143  K 3.6 3.5  CL 109 108  CO2 24  --   GLUCOSE 98 93  BUN 21 24*  CREATININE 2.30* 2.30*  CALCIUM 9.0  --     CBC: Recent Labs  Lab 08/06/18 1813 08/06/18 1816  WBC 7.9  --   NEUTROABS 3.0  --   HGB 12.3* 12.6*  HCT 37.1* 37.0*  MCV 89.6  --   PLT 244  --     Coagulation Studies: Recent Labs    08/06/18 1813  LABPROT 13.6  INR 1.05    Imaging: Ct Head Wo Contrast  Result Date: 08/06/2018 CLINICAL DATA:  Confusion with slurred speech. Last seen normal 5:15 p.m. EXAM: CT HEAD WITHOUT CONTRAST TECHNIQUE: Contiguous axial images were obtained from the base of the skull through the vertex without intravenous contrast. COMPARISON:  Head CT 05/02/2010 FINDINGS: Brain: There is no mass, hemorrhage or extra-axial collection. The size and configuration of the ventricles and extra-axial CSF spaces are normal. Encephalomalacia of the anterior inferior left frontal lobe multifocal calcification. Ischemic Vascular: No abnormal hyperdensity of the major intracranial arteries or dural venous sinuses. No intracranial atherosclerosis. Skull: The visualized skull base, calvarium and extracranial soft tissues are normal. Sinuses/Orbits: Diffuse nasal polypoid soft tissue thickening with complete opacification of both frontal sinuses. The orbits are normal. ASPECTS [facility] Stroke Program Early CT Score) - Ganglionic level infarction (caudate, lentiform nuclei, internal capsule, insula,  M1-M3 cortex): 7 - Supraganglionic infarction (M4-M6 cortex): 3 Total score (0-10 with 10 being normal): 10 Review of the MIP images confirms the above findings IMPRESSION: 1. No acute hemorrhage.  Chronic left frontal encephalomalacia. 2. ASPECTS is 10. These results were communicated to Dr. Karena Addison Akera Snowberger at 6:28 pm on 08/06/2018 by text page via the [facility] messaging system. Electronically Signed   By: Ulyses Jarred M.D.   On: 08/06/2018 18:30     ASSESSMENT AND PLAN  77 y.o. male with past  medical history of vertebral dissection, diabetes, hypertension, heart murmur, hyperlipidemia, CKD stage III presents to the emergency room as a stroke alert for sudden onset confusion, shivering.  Patient has no focal deficits.  Likely had a seizure versus hypertensive emergency.  Cannot be ruled out and recommend getting an MRI brain.  Also obtain metabolic work-up to rule out infection.  Recommend empiric Keppra thousand milligrams.  Home medication listed as Keppra in the past, need to clarify.   Encephalopathy D/DSeizures vs metabolic encephlaopathy vs HTN emergency vs Mild Stroke    MRI brain  Keppra 1g emperically given for possible seizure CBC, BMP, UA, UDS.    Talyssa Gibas [facility] Pager Number [phone]   #Addendum MRI shows punctate infarct in R frontal lobe, unlikely to explain patient's presentation of confusion.  However, will need further stroke workup.  Acute Ischemic Stroke   Risk factors Etiology:   Recommend #MRA Head and neck #Transthoracic Echo  # Start patient on ASA 325mg  daily #Start or continue Atorvastatin 40 mg/other high intensity statin # BP goal: permissive HTN upto 185/110 mmHg  # HBAIC and Lipid profile # Telemetry monitoring # Frequent neuro checks # stroke swallow screen  Will be followed by stroke team this morning  Please page stroke NP  Or  PA  Or MD from 8am -4 pm  as this patient from this time will be  followed by the stroke.   You can look them up on [email or web address]  Password [email or web address]

## 2018-08-07 ENCOUNTER — Inpatient Hospital Stay (HOSPITAL_COMMUNITY): Payer: Medicare Other

## 2018-08-07 ENCOUNTER — Observation Stay (HOSPITAL_COMMUNITY): Payer: Medicare Other

## 2018-08-07 ENCOUNTER — Other Ambulatory Visit: Payer: Self-pay

## 2018-08-07 DIAGNOSIS — G9389 Other specified disorders of brain: Secondary | ICD-10-CM | POA: Diagnosis present

## 2018-08-07 DIAGNOSIS — Z79899 Other long term (current) drug therapy: Secondary | ICD-10-CM | POA: Diagnosis not present

## 2018-08-07 DIAGNOSIS — I129 Hypertensive chronic kidney disease with stage 1 through stage 4 chronic kidney disease, or unspecified chronic kidney disease: Secondary | ICD-10-CM | POA: Diagnosis present

## 2018-08-07 DIAGNOSIS — Z7982 Long term (current) use of aspirin: Secondary | ICD-10-CM | POA: Diagnosis not present

## 2018-08-07 DIAGNOSIS — I161 Hypertensive emergency: Secondary | ICD-10-CM | POA: Diagnosis present

## 2018-08-07 DIAGNOSIS — G9340 Encephalopathy, unspecified: Secondary | ICD-10-CM | POA: Diagnosis not present

## 2018-08-07 DIAGNOSIS — I6381 Other cerebral infarction due to occlusion or stenosis of small artery: Secondary | ICD-10-CM | POA: Diagnosis present

## 2018-08-07 DIAGNOSIS — I639 Cerebral infarction, unspecified: Secondary | ICD-10-CM | POA: Diagnosis present

## 2018-08-07 DIAGNOSIS — I63421 Cerebral infarction due to embolism of right anterior cerebral artery: Secondary | ICD-10-CM

## 2018-08-07 DIAGNOSIS — N189 Chronic kidney disease, unspecified: Secondary | ICD-10-CM

## 2018-08-07 DIAGNOSIS — I672 Cerebral atherosclerosis: Secondary | ICD-10-CM | POA: Diagnosis present

## 2018-08-07 DIAGNOSIS — I160 Hypertensive urgency: Secondary | ICD-10-CM | POA: Diagnosis not present

## 2018-08-07 DIAGNOSIS — Z7901 Long term (current) use of anticoagulants: Secondary | ICD-10-CM | POA: Diagnosis not present

## 2018-08-07 DIAGNOSIS — N179 Acute kidney failure, unspecified: Secondary | ICD-10-CM | POA: Diagnosis present

## 2018-08-07 DIAGNOSIS — D649 Anemia, unspecified: Secondary | ICD-10-CM | POA: Diagnosis present

## 2018-08-07 DIAGNOSIS — R29704 NIHSS score 4: Secondary | ICD-10-CM | POA: Diagnosis present

## 2018-08-07 DIAGNOSIS — I2510 Atherosclerotic heart disease of native coronary artery without angina pectoris: Secondary | ICD-10-CM | POA: Diagnosis present

## 2018-08-07 DIAGNOSIS — Z8546 Personal history of malignant neoplasm of prostate: Secondary | ICD-10-CM | POA: Diagnosis not present

## 2018-08-07 DIAGNOSIS — R32 Unspecified urinary incontinence: Secondary | ICD-10-CM | POA: Diagnosis present

## 2018-08-07 DIAGNOSIS — R569 Unspecified convulsions: Secondary | ICD-10-CM | POA: Diagnosis present

## 2018-08-07 DIAGNOSIS — E1122 Type 2 diabetes mellitus with diabetic chronic kidney disease: Secondary | ICD-10-CM | POA: Diagnosis present

## 2018-08-07 DIAGNOSIS — R109 Unspecified abdominal pain: Secondary | ICD-10-CM | POA: Diagnosis present

## 2018-08-07 DIAGNOSIS — N183 Chronic kidney disease, stage 3 (moderate): Secondary | ICD-10-CM | POA: Diagnosis present

## 2018-08-07 DIAGNOSIS — R001 Bradycardia, unspecified: Secondary | ICD-10-CM | POA: Diagnosis present

## 2018-08-07 DIAGNOSIS — E785 Hyperlipidemia, unspecified: Secondary | ICD-10-CM | POA: Diagnosis present

## 2018-08-07 DIAGNOSIS — I674 Hypertensive encephalopathy: Secondary | ICD-10-CM | POA: Diagnosis present

## 2018-08-07 DIAGNOSIS — F0390 Unspecified dementia without behavioral disturbance: Secondary | ICD-10-CM | POA: Diagnosis present

## 2018-08-07 DIAGNOSIS — Z8673 Personal history of transient ischemic attack (TIA), and cerebral infarction without residual deficits: Secondary | ICD-10-CM | POA: Diagnosis not present

## 2018-08-07 DIAGNOSIS — Z7902 Long term (current) use of antithrombotics/antiplatelets: Secondary | ICD-10-CM | POA: Diagnosis not present

## 2018-08-07 DIAGNOSIS — G459 Transient cerebral ischemic attack, unspecified: Secondary | ICD-10-CM | POA: Diagnosis present

## 2018-08-07 LAB — BASIC METABOLIC PANEL
ANION GAP: 9 (ref 5–15)
BUN: 19 mg/dL (ref 8–23)
CALCIUM: 8.6 mg/dL — AB (ref 8.9–10.3)
CO2: 26 mmol/L (ref 22–32)
CREATININE: 2.21 mg/dL — AB (ref 0.61–1.24)
Chloride: 107 mmol/L (ref 98–111)
GFR calc Af Amer: 32 mL/min — ABNORMAL LOW (ref >60)
GFR, EST NON AFRICAN AMERICAN: 27 mL/min — AB (ref >60)
GLUCOSE: 77 mg/dL (ref 70–99)
Potassium: 3.2 mmol/L — ABNORMAL LOW (ref 3.5–5.1)
Sodium: 142 mmol/L (ref 135–145)

## 2018-08-07 LAB — GLUCOSE, CAPILLARY
GLUCOSE-CAPILLARY: 98 mg/dL (ref 70–99)
GLUCOSE-CAPILLARY: 105 mg/dL — AB (ref 70–99)
Glucose-Capillary: 95 mg/dL (ref 70–99)
Glucose-Capillary: 160 mg/dL — ABNORMAL HIGH (ref 70–99)
Glucose-Capillary: 108 mg/dL — ABNORMAL HIGH (ref 70–99)

## 2018-08-07 LAB — CBC
HCT: 34.3 % — ABNORMAL LOW (ref 39.0–52.0)
Hemoglobin: 11.4 g/dL — ABNORMAL LOW (ref 13.0–17.0)
MCH: 29.2 pg (ref 26.0–34.0)
MCHC: 33.2 g/dL (ref 30.0–36.0)
MCV: 87.9 fL (ref 78.0–100.0)
PLATELETS: 224 10*3/uL (ref 150–400)
RBC: 3.9 MIL/uL — ABNORMAL LOW (ref 4.22–5.81)
RDW: 14.3 % (ref 11.5–15.5)
WBC: 7.6 10*3/uL (ref 4.0–10.5)

## 2018-08-07 LAB — ECHOCARDIOGRAM COMPLETE
Height: 65 in
Weight: 2705.49 oz

## 2018-08-07 LAB — LIPID PANEL
CHOL/HDL RATIO: 4.3 ratio
Cholesterol: 191 mg/dL (ref 0–200)
HDL: 44 mg/dL (ref >40)
LDL CALC: 134 mg/dL — AB (ref 0–99)
TRIGLYCERIDES: 65 mg/dL (ref <150)
VLDL: 13 mg/dL (ref 0–40)

## 2018-08-07 LAB — TSH: TSH: 4.457 u[IU]/mL (ref 0.350–4.500)

## 2018-08-07 LAB — HEMOGLOBIN A1C
Hgb A1c MFr Bld: 5.8 % — ABNORMAL HIGH (ref 4.8–5.6)
Mean Plasma Glucose: 119.76 mg/dL

## 2018-08-07 MED ORDER — HYDRALAZINE HCL 50 MG PO TABS
100.0000 mg | ORAL_TABLET | Freq: Two times a day (BID) | ORAL | Status: DC
  Administered 2018-08-07 – 2018-08-09 (×5): 100 mg via ORAL
  Filled 2018-08-07 (×5): qty 2

## 2018-08-07 MED ORDER — CLOPIDOGREL BISULFATE 75 MG PO TABS
75.0000 mg | ORAL_TABLET | Freq: Every day | ORAL | Status: DC
  Administered 2018-08-07 – 2018-08-09 (×3): 75 mg via ORAL
  Filled 2018-08-07 (×3): qty 1

## 2018-08-07 MED ORDER — AMLODIPINE BESYLATE 10 MG PO TABS: 10.0000 mg | ORAL_TABLET | Freq: Every evening | ORAL | Status: DC

## 2018-08-07 MED ORDER — LEVETIRACETAM 750 MG PO TABS
750.0000 mg | ORAL_TABLET | Freq: Two times a day (BID) | ORAL | Status: DC
  Administered 2018-08-07 – 2018-08-09 (×5): 750 mg via ORAL
  Filled 2018-08-07 (×5): qty 1

## 2018-08-07 MED ORDER — POTASSIUM CHLORIDE CRYS ER 20 MEQ PO TBCR
20.0000 meq | EXTENDED_RELEASE_TABLET | Freq: Once | ORAL | Status: AC
  Administered 2018-08-07: 20 meq via ORAL
  Filled 2018-08-07: qty 1

## 2018-08-07 MED ORDER — POTASSIUM CHLORIDE CRYS ER 20 MEQ PO TBCR
40.0000 meq | EXTENDED_RELEASE_TABLET | ORAL | Status: AC
  Administered 2018-08-07: 40 meq via ORAL
  Filled 2018-08-07: qty 2

## 2018-08-07 MED ORDER — ASPIRIN 81 MG PO CHEW
81.0000 mg | CHEWABLE_TABLET | Freq: Every day | ORAL | Status: DC
  Administered 2018-08-08 – 2018-08-09 (×2): 81 mg via ORAL
  Filled 2018-08-07 (×2): qty 1

## 2018-08-07 NOTE — Progress Notes (Signed)
PROGRESS NOTE    BROADUS COSTILLA  XLK:[id number] DOB: 1941-02-08 DOA: 08/06/2018 PCP: Patient, No Pcp Per  Outpatient Specialists:     Brief Narrative: Mathew Garcia is a 77 y.o. male with medical history significant of HTN, HLD, DM type II, prostate cancer.  Patient presented after being found acutely altered.  History was obtained from the patient's daughter who was present at bedside.  He has been here visiting from [facility] where his primary care provider is Dr. Nyoka Cowden.  Patient reportedly last seen normal around 1 PM and after waking up sometime around 3 PM was reportedly altered.  However, daughter gone to pick up children and then came back around 520 who found him tremulous and staring off into space.  There was reports of urinary incontinence, but no seizure activity appreciated.  EMS was called as family was concerned for low blood sugars but initial CBG 93 per EMS.   08/07/2018: Work-up done, including MRI of the brain has revealed 6 mm acute right frontal infarct.  Neurology input is highly appreciated.  Neurology is directing care.  Assessment & Plan:   Active Problems:   DM (diabetes mellitus) ([place])   Hyperlipidemia   Acute encephalopathy   CVA (cerebral vascular accident) ([place])   Hypertensive emergency   Bradycardia   Acute kidney injury superimposed on chronic kidney disease ([place])   CAD (coronary artery disease)   Acute 6 mm right frontal infarct (acute CVA)/altered mental status:  Patient presents acutely altered with inability to speak and reported loss of urine.  Blood pressure was noted to be elevated up to 213/112.  Neurology consulted concerning for hypertensive urgency vs. Seizure vs. stroke.  CT scan initially showed no acute hemorrhage or acute abnormality.  MRI of the brain pending. - Admit to a stepdown bed - Neurochecks - Seizure precautions - Follow-up urine drug screen - Check hemoglobin A1c and lipid panel in a.m. - Add on TSH - Checking  MRI of the brain - Blood pressure control/permissive hypertension based off MRI - PT/OT/Speech therapy to evaluate and treat - ASA and atorvastatin - Continue Keppra per neuro recommendations - Appreciate neurology consultative services will follow-up for further recommendations 08/07/2018: Continue acute CVA protocol.  Echo is pending.  Patient is on aspirin, Plavix and statin.  For permissive hypertension for the next 48 to 72 hours following acute CVA.  Hypertension:  Initial blood pressures noted to be elevated into the 200s.  Patient does not appear to be taking any blood pressure medications. - Restart hydralazine - Held Coreg due to bradycardia 08/07/2018: Continue with permissive hypertension.  Bradycardia:  Initial heart rates 47-49 on admission.  Blood pressures currently elevated as seen above. - Hold rate controlling medications. 08/07/2018: Continue to monitor closely.  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  Last available creatinine is from 2014 around 1.9.  This could be patient's baseline given elevated blood pressures.  Patient was given 1 L of normal saline IV fluids in the ED. - Follow-up urinalysis - Gentle IV fluids - Recheck kidney function in a.m. 08/07/2018: Serum creatinine 2014 was 1.95.  Current serum creatinine is likely slow progression of chronic kidney disease.  Diabetes mellitus type II:  Blood sugars noted to be around 135.  Patient reportedly on glipizide unclear patient has been taking this medicine. - Hypoglycemic protocols - Hold glipizide - CBGs q. before meals and at bedtime with sensitive  SSI 08/07/2018: Continue to optimize.  CAD:  Last noted cardiac cath in Oct 2004 showed 50% proximal RCA lesion followed by a 40% LAD lesion - Diagonal #1 had sequential 30 and 20% lesions - Circumflex was angiographically normal. - Consider need of restarting  Plavix 08/07/2018: Stable  Hyperlipidemia - Follow-up lipid panel 08/07/2018: Total cholesterol is 191, LDL of 134 and HDL of 44. Continues statin.  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  Duration of anticoagulation previously undefined looking at notes from 2014. - Will need to discuss with neurology regarding   History of prostate cancer   DVT prophylaxis: heparin Code Status: Full Family Communication: No family present at bedside Disposition Plan: To be determined Consults called: Neurology  Antimicrobials:   None   Subjective: No history from patient. Patient continued to smile during the consultation. Most of the history came from the patient's daughter.  Objective: Vitals:   08/07/18 1103 08/07/18 1317 08/07/18 1348 08/07/18 1620  BP: (!) 210/85 (!) 198/71 (!) 180/83 (!) 202/90  Pulse:  (!) 51 77 (!) 47  Resp:  16 16 20   Temp:  (!) 97.5 F (36.4 C)  (!) 97.5 F (36.4 C)  TempSrc:  Oral  Oral  SpO2:  100% (!) 84% 100%  Weight:      Height:        Intake/Output Summary (Last 24 hours) at 08/07/2018 1850 Last data filed at 08/07/2018 1814 Gross per 24 hour  Intake 2267.72 ml  Output 1700 ml  Net 567.72 ml   Filed Weights   08/06/18 1856 08/06/18 2349  Weight: 80.7 kg 76.7 kg    Examination:  General exam: Appears calm and comfortable.  Not in any distress.  Awake and alert. Respiratory system: Clear to auscultation.  Cardiovascular system: S1 & S2 heard Gastrointestinal system: Abdomen is nondistended, soft and nontender. No organomegaly or masses felt. Normal bowel sounds heard. Central nervous system: Alert and oriented. No focal neurological deficits. Extremities: No leg edema.    Data Reviewed: I have personally reviewed following labs and imaging studies  CBC: Recent Labs  Lab 08/06/18 1813 08/06/18 1816 08/07/18 0322  WBC 7.9  --  7.6  NEUTROABS 3.0  --    --   HGB 12.3* 12.6* 11.4*  HCT 37.1* 37.0* 34.3*  MCV 89.6  --  87.9  PLT 244  --  712   Basic Metabolic Panel: Recent Labs  Lab 08/06/18 1813 08/06/18 1816 08/07/18 0322  NA 142 143 142  K 3.6 3.5 3.2*  CL 109 108 107  CO2 24  --  26  GLUCOSE 98 93 77  BUN 21 24* 19  CREATININE 2.30* 2.30* 2.21*  CALCIUM 9.0  --  8.6*   GFR: Estimated Creatinine Clearance: 27.2 mL/min (A) (by C-G formula based on SCr of 2.21 mg/dL (H)). Liver Function Tests: Recent Labs  Lab 08/06/18 1813  AST 20  ALT 11  ALKPHOS 112  BILITOT 0.7  PROT 7.4  ALBUMIN 3.2*   No results for input(s): LIPASE, AMYLASE in the last 168 hours. No results for input(s): AMMONIA in the last 168 hours. Coagulation Profile: Recent Labs  Lab 08/06/18 1813  INR 1.05   Cardiac Enzymes: No results for input(s): CKTOTAL, CKMB, CKMBINDEX, TROPONINI in the last 168 hours. BNP (last 3 results) No results for input(s): PROBNP in the last 8760 hours. HbA1C: Recent Labs    08/07/18 0322  HGBA1C 5.8*  CBG: Recent Labs  Lab 08/06/18 2229 08/07/18 0619 08/07/18 0756 08/07/18 1153 08/07/18 1614  GLUCAP 79 95 98 105* 160*   Lipid Profile: Recent Labs    08/07/18 0322  CHOL 191  HDL 44  LDLCALC 134*  TRIG 65  CHOLHDL 4.3   Thyroid Function Tests: Recent Labs    08/06/18 2340  TSH 4.457   Anemia Panel: No results for input(s): VITAMINB12, FOLATE, FERRITIN, TIBC, IRON, RETICCTPCT in the last 72 hours. Urine analysis:    Component Value Date/Time   COLORURINE STRAW (A) 08/06/2018 2227   APPEARANCEUR CLEAR 08/06/2018 2227   LABSPEC 1.009 08/06/2018 2227   PHURINE 6.0 08/06/2018 2227   GLUCOSEU NEGATIVE 08/06/2018 2227   HGBUR NEGATIVE 08/06/2018 2227   BILIRUBINUR NEGATIVE 08/06/2018 2227   KETONESUR NEGATIVE 08/06/2018 2227   PROTEINUR 100 (A) 08/06/2018 2227   UROBILINOGEN 0.2 01/31/2013 0320   NITRITE NEGATIVE 08/06/2018 2227   LEUKOCYTESUR NEGATIVE 08/06/2018 2227   Sepsis  Labs: @LABRCNTIP (procalcitonin:4,lacticidven:4)  )No results found for this or any previous visit (from the past 240 hour(s)).       Radiology Studies: Ct Head Wo Contrast  Result Date: 08/06/2018 CLINICAL DATA:  Confusion with slurred speech. Last seen normal 5:15 p.m. EXAM: CT HEAD WITHOUT CONTRAST TECHNIQUE: Contiguous axial images were obtained from the base of the skull through the vertex without intravenous contrast. COMPARISON:  Head CT 05/02/2010 FINDINGS: Brain: There is no mass, hemorrhage or extra-axial collection. The size and configuration of the ventricles and extra-axial CSF spaces are normal. Encephalomalacia of the anterior inferior left frontal lobe multifocal calcification. Ischemic Vascular: No abnormal hyperdensity of the major intracranial arteries or dural venous sinuses. No intracranial atherosclerosis. Skull: The visualized skull base, calvarium and extracranial soft tissues are normal. Sinuses/Orbits: Diffuse nasal polypoid soft tissue thickening with complete opacification of both frontal sinuses. The orbits are normal. ASPECTS ([facility] Stroke Program Early CT Score) - Ganglionic level infarction (caudate, lentiform nuclei, internal capsule, insula, M1-M3 cortex): 7 - Supraganglionic infarction (M4-M6 cortex): 3 Total score (0-10 with 10 being normal): 10 Review of the MIP images confirms the above findings IMPRESSION: 1. No acute hemorrhage.  Chronic left frontal encephalomalacia. 2. ASPECTS is 10. These results were communicated to Dr. Karena Addison Aroor at 6:28 pm on 08/06/2018 by text page via the [facility] messaging system. Electronically Signed   By: Ulyses Jarred M.D.   On: 08/06/2018 18:30   Mr Jodene Nam Head Wo Contrast  Result Date: 08/07/2018 CLINICAL DATA:  Follow-up stroke. EXAM: MRA HEAD WITHOUT CONTRAST TECHNIQUE: Angiographic images of the Circle of Willis were obtained using MRA technique without intravenous contrast. COMPARISON:  MR brain 08/06/2018.  CT head 08/06/2018.  FINDINGS: The internal carotid arteries demonstrate wide patency. Both ICA termini are patent. There is mild irregularity of the RIGHT M1 MCA. Moderate irregularity, long segment 50% stenosis LEFT M1 MCA. Dominant RIGHT A1 ACA. Diminutive LEFT A1 and A2 ACA. BILATERAL M2 MCA bifurcations are patent, but there is moderate irregularity of the M3 branches bilaterally consistent with intracranial atherosclerotic disease. Basilar artery widely patent with both vertebrals contributing, RIGHT slightly larger. Fetal origin RIGHT PCA. Severe stenosis, 75-90%, RIGHT P2 PCA. Moderate to severe disease of the LEFT PCA in its P3 segments and beyond. No saccular aneurysm. Visualized cerebellar branches show segmental irregularity consistent with intracranial atherosclerotic disease. Dominant LEFT PICA. IMPRESSION: No focal narrowing or dissection of the internal carotid arteries. 50% stenosis of the proximal LEFT MCA M1 segment; RIGHT M1 MCA irregular but without  stenosis. Severe 75-90% RIGHT P2 PCA stenosis. Advanced intracranial atherosclerotic change affecting the small to medium vessels. Electronically Signed   By: Staci Righter M.D.   On: 08/07/2018 13:21   Mr Brain Wo Contrast  Result Date: 08/06/2018 CLINICAL DATA:  Altered mental status after awaking from nap at 3 p.m. Hypertensive. Follow up code stroke. History of vertebral artery dissection, prostate cancer. EXAM: MRI HEAD WITHOUT CONTRAST TECHNIQUE: Multiplanar, multiecho pulse sequences of the brain and surrounding structures were obtained without intravenous contrast. COMPARISON:  CT HEAD August 06, 2018 and hip May 02, 2010 FINDINGS: INTRACRANIAL CONTENTS: 6 mm reduced diffusion mesial RIGHT frontal lobe with low ADC values. Spurs reduced diffusion, hemosiderin staining and area of LEFT frontal encephalomalacia. Ex vacuo dilatation LEFT lateral ventricle. No hydrocephalus. Patchy supratentorial and pontine white matter FLAIR T2 hyperintensities exclusive  aforementioned abnormality. Old small LEFT basal ganglia infarct. Prominent basal ganglia perivascular spaces associated chronic small vessel ischemic changes. Smaller LEFT cerebral peduncle compatible with wallerian degeneration. No abnormal extra-axial fluid collections. VASCULAR: Normal major intracranial vascular flow voids present at skull base. SKULL AND UPPER CERVICAL SPINE: No abnormal sellar expansion. No suspicious calvarial bone marrow signal. Craniocervical junction maintained. SINUSES/ORBITS: Lobulated severe frontal and ethmoid mucosal thickening. Included ocular globes and orbital contents are non-suspicious. OTHER: None. IMPRESSION: 1. Acute 6 mm RIGHT frontal infarct. 2. Old LEFT frontal/MCA territory infarct, less likely TBI. 3. Mild-to-moderate chronic small vessel ischemic changes and old LEFT basal ganglia small infarct. 4. Severe frontoethmoidal sinusitis and polyposis. Electronically Signed   By: Elon Alas M.D.   On: 08/06/2018 22:04   Dg Chest Port 1 View  Result Date: 08/06/2018 CLINICAL DATA:  Altered mental status EXAM: PORTABLE CHEST 1 VIEW COMPARISON:  Chest CT 02/01/2013. FINDINGS: Mild cardiomegaly. No confluent airspace opacities, effusions or edema. No acute bony abnormality. IMPRESSION: Mild cardiomegaly.  No active disease. Electronically Signed   By: Rolm Baptise M.D.   On: 08/06/2018 19:25        Scheduled Meds: . [START ON 08/08/2018] aspirin  81 mg Oral Daily  . atorvastatin  80 mg Oral q1800  . clopidogrel  75 mg Oral Daily  . heparin  5,000 Units Subcutaneous Q8H  . hydrALAZINE  100 mg Oral BID  . insulin aspart  0-5 Units Subcutaneous QHS  . insulin aspart  0-9 Units Subcutaneous TID WC  . levETIRAcetam  750 mg Oral BID   Continuous Infusions: . sodium chloride 1,000 mL (08/06/18 2349)     LOS: 0 days    Time spent: 35 minutes    Dana Allan, MD  [facility] Pager #: [phone] 7PM-7AM contact night coverage as  above

## 2018-08-07 NOTE — Evaluation (Signed)
Occupational Therapy Evaluation Patient Details Name: Mathew Garcia MRN: [id number] DOB: [birth year] Today's Date: 08/07/2018    History of Present Illness  77 y.o. M admitted (+) MRI shows acute 6 mm right frontal infarct, old left frontal/MCA territory infarct, mild to moderate chronic small vessel ischemic changes and old left basal ganglia small infarct, and severe frontoethmoidal sinusitis and polyposis PMH HTN, HLD, DM type II, prostate CA   Clinical Impression   PT admitted with acute stroke R frontal infarct. Pt currently with functional limitiations due to the deficits listed below (see OT problem list). Pt currently with cognitive deficits and fall risk. Pt will require 24/7 A upon d/c. Pt should not drive or have access to car to drive at this time.  Pt will benefit from skilled OT to increase their independence and safety with adls and balance to allow discharge home with HHOT.     Follow Up Recommendations  Home health OT    Equipment Recommendations  None recommended by OT    Recommendations for Other Services       Precautions / Restrictions Precautions Precautions: Fall Restrictions Weight Bearing Restrictions: No      Mobility Bed Mobility Overal bed mobility: Modified Independent                Transfers Overall transfer level: Needs assistance Equipment used: None;Rolling walker (2 wheeled) Transfers: Sit to/from Stand Sit to Stand: Min guard              Balance Overall balance assessment: Mild deficits observed, not formally tested                                         ADL either performed or assessed with clinical judgement   ADL Overall ADL's : Needs assistance/impaired Eating/Feeding: Set up   Grooming: Min guard Grooming Details (indicate cue type and reason): washing hands automatically                 Toilet Transfer: Min guard           Functional mobility during ADLs: Min guard General ADL  Comments: Pt noted to drag the R le slighting during transfer but able to advance R LE without (A) .      Vision   Additional Comments: difficult to formally assess but pt able to locate items when asked to point      Perception     Praxis      Pertinent Vitals/Pain Pain Assessment: No/denies pain Faces Pain Scale: Hurts a little bit Pain Location: pt points across his lower chest to indicate area of pain     Hand Dominance Right   Extremity/Trunk Assessment Upper Extremity Assessment Upper Extremity Assessment: Difficult to assess due to impaired cognition;RUE deficits/detail;LUE deficits/detail RUE Deficits / Details: AROM FF shoulder to 100 degrees during session - cognitive deficits affecting following commands LUE Deficits / Details: AROM FF noted but different to fully assess due to cognition. pt able to reach face and head    Lower Extremity Assessment Lower Extremity Assessment: Defer to PT evaluation RLE Deficits / Details: Grossly 4/5 LLE Deficits / Details: Grossly 4/5   Cervical / Trunk Assessment Cervical / Trunk Assessment: Kyphotic   Communication Communication Communication: Other (comment)(laughing inappropriately)   Cognition Arousal/Alertness: Awake/alert Behavior During Therapy: WFL for tasks assessed/performed Overall Cognitive Status: Impaired/Different from baseline Area of Impairment: Orientation;Attention;Memory;Following  commands;Safety/judgement;Awareness                 Orientation Level: Disoriented to;Place;Time;Situation Current Attention Level: Sustained Memory: Decreased short-term memory Following Commands: Follows one step commands inconsistently Safety/Judgement: Decreased awareness of deficits Awareness: Intellectual   General Comments: pt laughing inappropriately throughout session. daughter reports the teeth grinding is baseline and that he has even taught grandchildren to do it because its his normal. pt able to recall  location and city at the end of session. pt does perseverating on verbal responses. pt reports "jamie" daughter present as his name. pt unable to follow 2 step command   General Comments  pt family present    Exercises     Shoulder Instructions      Home Living Family/patient expects to be discharged to:: Private residence Living Arrangements: Children Available Help at Discharge: Family Type of Home: House Home Access: Stairs to enter Technical brewer of Steps: 3 Entrance Stairs-Rails: Can reach both Home Layout: One level               [place] - single point   Additional Comments: lives in a house with daughter in [place]. x4 daughters live in [place] and plan to arrange 24/7 (A) for patient. Pt has x4 children ( 2 sons/ 2 daughters ) that are currently living in [place]. pt does not drive but did drive to [place]. Daughter that he lives with in [facility] completes all house home and cooking task. pt with mild memory deficits since previous stroke.       Prior Functioning/Environment Level of Independence: Needs assistance    ADL's / Homemaking Assistance Needed: completes bathing/ dressing without (A). but requires (A) for cooking/ cleaning   Comments: uses cane intermittently and independent with ADL's. Short term memory deficits from stroke several years prior.        OT Problem List: Decreased strength;Decreased activity tolerance;Impaired balance (sitting and/or standing);Impaired vision/perception;Decreased coordination;Decreased cognition;Decreased safety awareness;Decreased knowledge of use of DME or AE;Decreased knowledge of precautions;Obesity      OT Treatment/Interventions: Self-care/ADL training;Therapeutic exercise;Neuromuscular education;Energy conservation;DME and/or AE instruction;Manual therapy;Modalities;Therapeutic activities;Cognitive remediation/compensation;Visual/perceptual remediation/compensation;Patient/family education;Balance training     OT Goals(Current goals can be found in the care plan section) Acute Rehab OT Goals Patient Stated Goal: none stated- family present waiting patient to return to [facility] home with family (A) OT Goal Formulation: Patient unable to participate in goal setting Time For Goal Achievement: 08/21/18 Potential to Achieve Goals: Good  OT Frequency: Min 2X/week   Barriers to D/C:            Co-evaluation              AM-PAC PT "6 Clicks" Daily Activity     Outcome Measure Help from another person eating meals?: A Little Help from another person taking care of personal grooming?: A Little Help from another person toileting, which includes using toliet, bedpan, or urinal?: A Little Help from another person bathing (including washing, rinsing, drying)?: A Little Help from another person to put on and taking off regular upper body clothing?: A Little Help from another person to put on and taking off regular lower body clothing?: A Little 6 Click Score: 18   End of Session Equipment Utilized During Treatment: Gait belt Nurse Communication: Mobility status;Precautions  Activity Tolerance: Patient tolerated treatment well Patient left: in chair;with call bell/phone within reach;with chair alarm set;with family/visitor present  OT Visit Diagnosis: Unsteadiness on feet (R26.81);Muscle weakness (generalized) (M62.81)  Time: 2993-7169 OT Time Calculation (min): 25 min Charges:  OT General Charges $OT Visit: 1 Visit OT Evaluation $OT Eval Moderate Complexity: 1 Mod OT Treatments $Self Care/Home Management : 8-22 mins   Jeri Modena   OTR/L Pager: [phone] Office: [phone] .   Parke Poisson B 08/07/2018, 12:34 PM

## 2018-08-07 NOTE — Evaluation (Addendum)
Speech Language Pathology Evaluation Patient Details Name: Mathew Garcia MRN: [id number] DOB: January 25, 1941 Today's Date: 08/07/2018 Time: 0945-1000 SLP Time Calculation (min) (ACUTE ONLY): 15 min  Problem List:  Patient Active Problem List   Diagnosis Date Noted  . CVA (cerebral vascular accident) ([place]) 08/07/2018  . Hypertensive emergency 08/07/2018  . Bradycardia 08/07/2018  . Acute kidney injury superimposed on chronic kidney disease ([place]) 08/07/2018  . CAD (coronary artery disease) 08/07/2018  . Acute encephalopathy 08/06/2018  . Pulmonary nodules 02/03/2013  . CKD (chronic kidney disease), stage III ([place]) 02/03/2013  . Vertebral artery dissection ([place]) 01/31/2013  . HTN (hypertension) 01/31/2013  . DM (diabetes mellitus) ([place]) 01/31/2013  . Heart murmur 01/31/2013  . Hyperlipidemia 01/31/2013   Past Medical History:  Past Medical History:  Diagnosis Date  . Diabetes mellitus without complication ([place])   . Heart murmur   . Hypertension   . Prostate cancer [facility])    Past Surgical History:  Past Surgical History:  Procedure Laterality Date  . PROSTATE SURGERY     HPI:  77 yo male adm to [facility] with AMS - urinary incontinence, tremulous and staring with inability to speak.  Per imaging study, pt found to have a right frontal cva.  Pt has PMH + for prostate cancer, vertebral arter dissection, old left basal ganglia CVA.  Pt resides in [facility] and is here in [place] visiting family.     Assessment / Plan / Recommendation Clinical Impression  Pt presents with significant expressive and receptive language deficits impacting his ability to participate in medical care.  He is able to indicate pain = across chest= and states RN is aware and denies need for medication.  Pt able to state his first and last name but not middle.  He repeatedly stated "nothing" during testing and answered "yes" to all biographical/current environment y/n questions.  He names 3/4 items correctly - did not read correct  word or repeat correctly with max assist.  Automatic speech tasks including counting, singing completed with min cues.   [place] of fluent speech noted with pt stating "Where is she going?"  Inconsistent ability to follow one step commands and repeat single words noted- however pt also distracted by people/staff in room with him.  Limited eval as pt being taken for MRI.  Pt will benefit from skilled SlP to address cognitive lingiustic goals to help maximize his function and decrease caregiver burden.     SLP Assessment  SLP Recommendation/Assessment: Patient needs continued Speech Lanaguage Pathology Services SLP Visit Diagnosis: Aphasia (R47.01);Cognitive communication deficit (R41.841)    Follow Up Recommendations  Skilled Nursing facility vs Home Health with 24/7 Supervision    Frequency and Duration min 1 x/week  1 week      SLP Evaluation Cognition  Arousal/Alertness: Awake/alert Orientation Level: Oriented to person;Disoriented to time;Disoriented to situation;Disoriented to place Attention: Sustained Sustained Attention: Impaired Sustained Attention Impairment: Verbal basic(distractions with family in room and staff coming to order meal) Memory: Impaired Memory Impairment: Storage deficit;Retrieval deficit;Decreased short term memory Awareness: (pt demonstrates frustration with communication deficits)       Comprehension  Auditory Comprehension Overall Auditory Comprehension: Impaired Yes/No Questions: Impaired Basic Biographical Questions: 51-75% accurate Basic Immediate Environment Questions: 25-49% accurate Commands: Impaired One Step Basic Commands: 25-49% accurate Visual Recognition/Discrimination Discrimination: Not tested Reading Comprehension Reading Status: Impaired(pt unable to read large printed word "tissue")    Expression Expression Primary Mode of Expression: Verbal Verbal Expression Overall Verbal Expression: Impaired Initiation: Impaired Automatic  Speech:  Counting;Singing(with mod I) Level of Generative/Spontaneous Verbalization: Sentence("where did she go?", [place] of fluent expression) Repetition: Impaired Level of Impairment: Word level Naming: No impairment(for 4 items) Pragmatics: No impairment Effective Techniques: Articulatory cues;Phonemic cues   Oral / Motor  Oral Motor/Sensory Function Overall Oral Motor/Sensory Function: Within functional limits Motor Speech Respiration: Within functional limits Phonation: Normal Resonance: Within functional limits Articulation: Within functional limitis Intelligibility: Intelligible Motor Planning: Impaired Level of Impairment: Word Motor Speech Errors: Inconsistent(inconsistent awareness)   GO                    Mathew Garcia 08/07/2018, 10:20 AM  Luanna Salk, [facility] SLP [phone]

## 2018-08-07 NOTE — Progress Notes (Signed)
Dr Melvern Banker is primary MD of pt in [place]. [phone]

## 2018-08-07 NOTE — Progress Notes (Signed)
EEG complete - results pending 

## 2018-08-07 NOTE — Progress Notes (Signed)
Admitted to 3X52 via stretcher.  Alert responsive, unable to assist with stretcher to bed transfer.  Responds to name, birthday & hospital name.  Daughter at bedside with children, then home. Seizure precautions initiated, rails up, safety precautions reviewed. Only valuables at bedside are clothes.

## 2018-08-07 NOTE — Progress Notes (Addendum)
STROKE TEAM PROGRESS NOTE   INTERVAL HISTORY His daughter is at the bedside.  He is from [facility] and has been staying/visiting with her for the past 2 weeks. Plans are to eventually return back to [facility] to stay with his daughters there. For now, he will be here. At baseline, he is demented but can assist with ADLs with prompting.   Daughter states this all started yesterday while he was laying in bed when he got stiff all over, pulled his arms up to his chest and would not talk or respond to her. She reports he had a hx of a stroke 1 year ago in [place]. She thinks he has had a seizure before and was on medication for it.   Vitals:   08/06/18 2349 08/07/18 0130 08/07/18 0330 08/07/18 0726  BP: (!) 216/75 (!) 187/68 (!) 175/65 (!) 207/91  Pulse: (!) 46 (!) 50 (!) 47 (!) 54  Resp: 16 18 17 20   Temp: 98.6 F (37 C) 98.6 F (37 C) 98.6 F (37 C) 98.2 F (36.8 C)  TempSrc: Oral Oral Oral Oral  SpO2: 100% 100% 100% 100%  Weight: 76.7 kg     Height: 5\' 5"  (1.651 m)       CBC:  Recent Labs  Lab 08/06/18 1813 08/06/18 1816 08/07/18 0322  WBC 7.9  --  7.6  NEUTROABS 3.0  --   --   HGB 12.3* 12.6* 11.4*  HCT 37.1* 37.0* 34.3*  MCV 89.6  --  87.9  PLT 244  --  161    Basic Metabolic Panel:  Recent Labs  Lab 08/06/18 1813 08/06/18 1816 08/07/18 0322  NA 142 143 142  K 3.6 3.5 3.2*  CL 109 108 107  CO2 24  --  26  GLUCOSE 98 93 77  BUN 21 24* 19  CREATININE 2.30* 2.30* 2.21*  CALCIUM 9.0  --  8.6*   Lipid Panel:     Component Value Date/Time   CHOL 191 08/07/2018 0322   TRIG 65 08/07/2018 0322   HDL 44 08/07/2018 0322   CHOLHDL 4.3 08/07/2018 0322   VLDL 13 08/07/2018 0322   LDLCALC 134 (H) 08/07/2018 0322   HgbA1c:  Lab Results  Component Value Date   HGBA1C 5.8 (H) 08/07/2018   Urine Drug Screen:     Component Value Date/Time   LABOPIA NONE DETECTED 08/06/2018 2227   COCAINSCRNUR NONE DETECTED 08/06/2018 2227   LABBENZ NONE DETECTED 08/06/2018 2227   AMPHETMU NONE  DETECTED 08/06/2018 2227   THCU NONE DETECTED 08/06/2018 2227   LABBARB NONE DETECTED 08/06/2018 2227    Alcohol Level     Component Value Date/Time   ETH <10 08/06/2018 1813    IMAGING Ct Head Wo Contrast  Result Date: 08/06/2018 CLINICAL DATA:  Confusion with slurred speech. Last seen normal 5:15 p.m. EXAM: CT HEAD WITHOUT CONTRAST TECHNIQUE: Contiguous axial images were obtained from the base of the skull through the vertex without intravenous contrast. COMPARISON:  Head CT 05/02/2010 FINDINGS: Brain: There is no mass, hemorrhage or extra-axial collection. The size and configuration of the ventricles and extra-axial CSF spaces are normal. Encephalomalacia of the anterior inferior left frontal lobe multifocal calcification. Ischemic Vascular: No abnormal hyperdensity of the major intracranial arteries or dural venous sinuses. No intracranial atherosclerosis. Skull: The visualized skull base, calvarium and extracranial soft tissues are normal. Sinuses/Orbits: Diffuse nasal polypoid soft tissue thickening with complete opacification of both frontal sinuses. The orbits are normal. ASPECTS [facility] Stroke Program Early  CT Score) - Ganglionic level infarction (caudate, lentiform nuclei, internal capsule, insula, M1-M3 cortex): 7 - Supraganglionic infarction (M4-M6 cortex): 3 Total score (0-10 with 10 being normal): 10 Review of the MIP images confirms the above findings IMPRESSION: 1. No acute hemorrhage.  Chronic left frontal encephalomalacia. 2. ASPECTS is 10. These results were communicated to Dr. Karena Addison Aroor at 6:28 pm on 08/06/2018 by text page via the [facility] messaging system. Electronically Signed   By: Ulyses Jarred M.D.   On: 08/06/2018 18:30   Mr Jodene Nam Head Wo Contrast  Result Date: 08/07/2018 CLINICAL DATA:  Follow-up stroke. EXAM: MRA HEAD WITHOUT CONTRAST TECHNIQUE: Angiographic images of the Circle of Willis were obtained using MRA technique without intravenous contrast. COMPARISON:  MR  brain 08/06/2018.  CT head 08/06/2018. FINDINGS: The internal carotid arteries demonstrate wide patency. Both ICA termini are patent. There is mild irregularity of the RIGHT M1 MCA. Moderate irregularity, long segment 50% stenosis LEFT M1 MCA. Dominant RIGHT A1 ACA. Diminutive LEFT A1 and A2 ACA. BILATERAL M2 MCA bifurcations are patent, but there is moderate irregularity of the M3 branches bilaterally consistent with intracranial atherosclerotic disease. Basilar artery widely patent with both vertebrals contributing, RIGHT slightly larger. Fetal origin RIGHT PCA. Severe stenosis, 75-90%, RIGHT P2 PCA. Moderate to severe disease of the LEFT PCA in its P3 segments and beyond. No saccular aneurysm. Visualized cerebellar branches show segmental irregularity consistent with intracranial atherosclerotic disease. Dominant LEFT PICA. IMPRESSION: No focal narrowing or dissection of the internal carotid arteries. 50% stenosis of the proximal LEFT MCA M1 segment; RIGHT M1 MCA irregular but without stenosis. Severe 75-90% RIGHT P2 PCA stenosis. Advanced intracranial atherosclerotic change affecting the small to medium vessels. Electronically Signed   By: Staci Righter M.D.   On: 08/07/2018 13:21   Mr Brain Wo Contrast  Result Date: 08/06/2018 CLINICAL DATA:  Altered mental status after awaking from nap at 3 p.m. Hypertensive. Follow up code stroke. History of vertebral artery dissection, prostate cancer. EXAM: MRI HEAD WITHOUT CONTRAST TECHNIQUE: Multiplanar, multiecho pulse sequences of the brain and surrounding structures were obtained without intravenous contrast. COMPARISON:  CT HEAD August 06, 2018 and hip May 02, 2010 FINDINGS: INTRACRANIAL CONTENTS: 6 mm reduced diffusion mesial RIGHT frontal lobe with low ADC values. Spurs reduced diffusion, hemosiderin staining and area of LEFT frontal encephalomalacia. Ex vacuo dilatation LEFT lateral ventricle. No hydrocephalus. Patchy supratentorial and pontine white matter  FLAIR T2 hyperintensities exclusive aforementioned abnormality. Old small LEFT basal ganglia infarct. Prominent basal ganglia perivascular spaces associated chronic small vessel ischemic changes. Smaller LEFT cerebral peduncle compatible with wallerian degeneration. No abnormal extra-axial fluid collections. VASCULAR: Normal major intracranial vascular flow voids present at skull base. SKULL AND UPPER CERVICAL SPINE: No abnormal sellar expansion. No suspicious calvarial bone marrow signal. Craniocervical junction maintained. SINUSES/ORBITS: Lobulated severe frontal and ethmoid mucosal thickening. Included ocular globes and orbital contents are non-suspicious. OTHER: None. IMPRESSION: 1. Acute 6 mm RIGHT frontal infarct. 2. Old LEFT frontal/MCA territory infarct, less likely TBI. 3. Mild-to-moderate chronic small vessel ischemic changes and old LEFT basal ganglia small infarct. 4. Severe frontoethmoidal sinusitis and polyposis. Electronically Signed   By: Elon Alas M.D.   On: 08/06/2018 22:04   Dg Chest Port 1 View  Result Date: 08/06/2018 CLINICAL DATA:  Altered mental status EXAM: PORTABLE CHEST 1 VIEW COMPARISON:  Chest CT 02/01/2013. FINDINGS: Mild cardiomegaly. No confluent airspace opacities, effusions or edema. No acute bony abnormality. IMPRESSION: Mild cardiomegaly.  No active disease. Electronically Signed  By: Rolm Baptise M.D.   On: 08/06/2018 19:25    PHYSICAL EXAM General: Appears well-developed and well-nourished.  Psych: Affect appropriate to situation Eyes: No scleral injection HENT: No OP obstrucion Head: Normocephalic.  Cardiovascular: Normal rate and regular rhythm.  Skin: WDI  Neurological Examination Mental Status: Alert to name, thinks he is 33 years ago. He laughs when asked where he is. He knows daughter and can name all objects except lipstick. He can repeat sentences and can show 2 fingers. He perseverates with further commands.  Cranial Nerves: II: Visual  fields grossly normal, blinks to threat bilaterally III,IV, VI: ptosis not present, extra-ocular motions intact bilaterally, pupils equal, round, reactive to light and accommodation V,VII: smile symmetric, facial light touch sensation normal bilaterally VIII: hearing normal bilaterally IX,X: uvula rises symmetrically XI: bilateral shoulder shrug XII: midline tongue extension Motor: Right :  Upper extremity   5/5                                      Left:     Upper extremity   5/5             Lower extremity   5/5                                                  Lower extremity   5/5 Tone and bulk:normal tone throughout; no atrophy noted Sensory: Pinprick and light touch intact throughout, bilaterally Plantars: Right: downgoing                                Left: downgoing Cerebellar: normal finger-to-nose, normal rapid alternating movements  Gait: did not assess    ASSESSMENT/PLAN Mr. MEARLE DREW is a 77 y.o. male with history of VA dissection, DB, HTN, HLD, heart murmur, CKD stage III presenting with sudden onset confusion, shivering, no speaking. Low suspicion for stroke.  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 Chronic L frontal encephalomalacia. ASPECTS 10.     MRI  Small R frontal infarct. Old L frontal MCA infarct (new since 2011 imaging). Old L BG infarct. Small vessel disease. Severe sinusitis and polyposis.   MRA  ICAs ok. L M1 50% stenosis, R M1 irregular. Severe R P2 75-90% stenosis. Advanced intracranial atherosclerosis   Carotid Doppler  pending   2D Echo  pending   LDL 134  HgbA1c 5.8  Heparin 5000 units sq tid for VTE prophylaxis  aspirin 81 mg daily and clopidogrel 75 mg daily prior to admission, now on aspirin 325 mg daily. Continue DAPT at d/c x  3 months then plavix alone  Therapy recommendations:  HH OT, 24h supervision  Disposition:  pending  (lives w/ dtrs in [facility], staying with dtr in [place] for past 2 weeks with plans to return to [facility] when able)  Hypertensive Emergency Possible Hypertensive Encephalopathy  BP > 300 per EMS  Remains elevated but within goal of permissive hypertension (OK if < 220/120); gradually normalize  in 5-7 days . Long-term BP goal normotensive  Hyperlipidemia  Home meds:  zocor 20  Now on Lipitor 80 in hospital  LDL 134, goal < 70  Continue statin at discharge  Diabetes type II  HgbA1c 5.8, goal < 7.0  Controlled  Other Stroke Risk Factors  Advanced age  Hx stroke/TIA  1 year ago in [facility] - L frontal  01/2013 - L VA dissection - presented w/ severe pain in R neck. Treated w/ coumadin  Other Active Problems  CKD stage III Cr 2.21  Anemia Hgb 11.4  Hospital day # 0  Burnetta Sabin, MSN, APRN, ANVP-BC, AGPCNP-BC Advanced Practice Stroke Nurse [place] for Schedule & Pager information 08/07/2018 2:13 PM   To contact Stroke Continuity provider, please refer to [email or web address] After hours, contact General Neurology  ATTENDING NOTE: I reviewed above note and agree with the assessment and plan. I have made any additions or clarifications directly to the above note. Pt was seen and examined.   77 year old male with history of diabetes, hypertension, CKD, dementia, prostate cancer status post surgery admitted for confusion, arm stiffness with shaking, speech difficulty and hypertension.  Patient came from [facility] visiting daughter.  As per daughter, he was coming out from kitchen, and started to have difficulty walking, whole body shaking with both arm flexed and stiff, teeth clenched, nonverbal, not following commands, eyes open, staring.  Episode lasted about 8 minutes, on EMS arrival, patient starting to loosen up, but blood pressure 300s.  On hospital arrival,  daughter said patient mostly back to baseline.  I also talked to patient daughter in [place] over the phone.  Patient had stroke last year in [place], but details unknown, no obvious neuro deficit, has neurology follow-up locally.  No cause of stroke was told, and patient has baseline dementia at home.  On exam, patient pleasant, laughing inappropriately, not orientated to place or time or age, but orientated to people.  Blinking to visual threat bilaterally, no facial asymmetry, tongue midline.  Did not cooperate with naming or repetition.  Fluent language but intelligible word, moderate to severe dysarthria.  Moving all extremities symmetrical. Sensation, coordination and gait not tested.  CT head showed left frontal moderate encephalomalacia with laminar necrosis.  MRI showed right frontal punctate acute infarct, with left frontal encephalomalacia and laminar necrosis.  MRA left M1 50% stenosis, right P2 75 to 90% stenosis.  EF 60 to 65%, and carotid Doppler unremarkable.  EEG pending.  LDL 134 and A1c 5.8.  UDS negative.  Patient presentation resembles seizure activity.  His left frontal encephalomalacia could be seizure focus, and his hypertensive emergency can also lower the seizure threshold.  He was loaded with Keppra and continued on Keppra 750 mg twice daily.  He is punctate stroke likely to be incidental finding.  Will recommend continue Keppra for seizure prevention, continue DAPT for 3 weeks and then Plavix alone.  Continue Lipitor for stroke prevention.  Patient has local neurologist [place] to follow of seizure and stroke.  Also recommend discussed with local neurologist to consider 30-day cardio event monitoring to rule out A. fib.  Rosalin Hawking, MD PhD Stroke Neurology 08/07/2018 6:22 PM  I spent  35 minutes in total face-to-face time with the patient, more than 50% of which was spent in counseling and coordination of care, reviewing test results, images and  medication, and discussing the diagnosis of seizure, stroke, hypertensive emergency, treatment plan and potential prognosis. This patient's  care requiresreview of multiple databases, neurological assessment, discussion with family, other specialists and medical decision making of high complexity.

## 2018-08-07 NOTE — Progress Notes (Signed)
Preliminary notes--Bilateral carotid duplex exam completed. Bilateral ICAs 1-39% stenosis based on the classification of velocities.  Bilateral vertebral arteries patent with antegrade flow. Hongying Nikeisha Klutz (RDMS RVT) 08/07/18 3:13 PM

## 2018-08-07 NOTE — Procedures (Signed)
ELECTROENCEPHALOGRAM REPORT   Patient: Mathew Garcia       Room #: 3J09U EEG No. ID: [id number] Age: 77 y.o.        Sex: male Referring Physician: Erlinda Hong Report Date:  08/07/2018        Interpreting Physician: Alexis Goodell  History: JAWAAN ADACHI is an 77 y.o. male with altered mental status  Medications:  ASA, Lipitor, Plavix, Apresoline, Insulin, Keppra  Conditions of Recording:  This is a 21 channel routine scalp EEG performed with bipolar and monopolar montages arranged in accordance to the international 10/20 system of electrode placement. One channel was dedicated to EKG recording.  The patient is in the awake state.  Description:  The waking background activity consists of a low voltage, symmetrical, fairly well organized, 8 Hz alpha activity, seen from the parieto-occipital and posterior temporal regions.  Low voltage fast activity, poorly organized, is seen anteriorly and is at times superimposed on more posterior regions.  A mixture of theta and alpha rhythms are seen from the central and temporal regions. The patient does not drowse or sleep. No epileptiform activity is noted.   Hyperventilation and intermittent photic stimulation were not performed.  IMPRESSION: This is a normal awake electroencephalogram. There are no focal lateralizing or epileptiform features.   Comment:  An EEG with the patient sleep deprived to elicit drowse and light sleep may be desirable to further elicit a possible seizure disorder.     Alexis Goodell, MD Neurology [phone] 08/07/2018, 6:54 PM

## 2018-08-07 NOTE — Progress Notes (Signed)
  Echocardiogram 2D Echocardiogram has been performed.  Jennette Dubin 08/07/2018, 2:39 PM

## 2018-08-07 NOTE — Evaluation (Addendum)
Physical Therapy Evaluation Patient Details Name: Mathew Garcia MRN: [id number] DOB: 1941-05-12 Today's Date: 08/07/2018   History of Present Illness  Pt is a 77 y.o. M with  MRI shows acute 6 mm right frontal infarct, old left frontal/MCA territory infarct, mild to moderate chronic small vessel ischemic changes and old left basal ganglia small infarct, and severe frontoethmoidal sinusitis and polyposis. Significant PMH includers HTN, HLD, DM type II, prostate cancer who presents after being found acutely altered.  Clinical Impression  On PT evaluation, patient main deficit is decreased cognition compared to baseline. Oriented to self only and follows simple commands inconsistently. In regard to mobility, patient walking 200 feet in hallway with a walker and then with no assistive device with supervision for safety. HR 56-59 bpm. No gross balance deficits noted. Will continue to follow acutely to challenge cognition and high level balance activities including dual tasking with ambulation.     Follow Up Recommendations Supervision/Assistance - 24 hour;Home Health PT    Equipment Recommendations  None recommended by PT    Recommendations for Other Services       Precautions / Restrictions Precautions Precautions: Fall Restrictions Weight Bearing Restrictions: No      Mobility  Bed Mobility Overal bed mobility: Modified Independent                Transfers Overall transfer level: Needs assistance Equipment used: None;Rolling walker (2 wheeled) Transfers: Sit to/from Stand Sit to Stand: Supervision            Ambulation/Gait Ambulation/Gait assistance: Supervision Gait Distance (Feet): 200 Feet Assistive device: Rolling walker (2 wheeled);None Gait Pattern/deviations: Step-through pattern;Wide base of support     General Gait Details: Initially trialed walker for ambulation and patient lightly holding on for balance so transitioned to no device with no overt LOB  or gross imbalance noted  Stairs            Wheelchair Mobility    Modified Rankin (Stroke Patients Only) Modified Rankin (Stroke Patients Only) Pre-Morbid Rankin Score: No significant disability Modified Rankin: Moderately severe disability     Balance Overall balance assessment: Mild deficits observed, not formally tested                                           Pertinent Vitals/Pain Pain Assessment: Faces Faces Pain Scale: Hurts a little bit Pain Location: pt points across his lower chest to indicate area of pain    Home Living Family/patient expects to be discharged to:: Private residence Living Arrangements: Children(daughter) Available Help at Discharge: Family Type of Home: Apartment Home Access: Stairs to enter Entrance Stairs-Rails: Can reach both Entrance Stairs-Number of Steps: 3 Home Layout: One level Home Equipment: Cane - single point Additional Comments: Lives in [place] but was visiting daughter here in [place]. Daughter states she plans to take him home with her initially.    Prior Function Level of Independence: Independent with assistive device(s)         Comments: uses cane intermittently and independent with ADL's. Short term memory deficits from stroke several years prior.     Hand Dominance        Extremity/Trunk Assessment   Upper Extremity Assessment Upper Extremity Assessment: Difficult to assess due to impaired cognition;RUE deficits/detail;LUE deficits/detail RUE Deficits / Details: Active shoulder elevation to ~100 degrees. weak handgrip LUE Deficits / Details: Active shoulder elevation to ~  100 degrees. weak handgrip    Lower Extremity Assessment Lower Extremity Assessment: RLE deficits/detail;LLE deficits/detail;Difficult to assess due to impaired cognition RLE Deficits / Details: Grossly 4/5 LLE Deficits / Details: Grossly 4/5       Communication   Communication: No difficulties  Cognition Arousal/Alertness:  Awake/alert Behavior During Therapy: WFL for tasks assessed/performed Overall Cognitive Status: Impaired/Different from baseline Area of Impairment: Orientation;Attention;Memory;Following commands;Safety/judgement;Awareness                 Orientation Level: Disoriented to;Place;Time;Situation Current Attention Level: Sustained Memory: Decreased short-term memory Following Commands: Follows one step commands inconsistently Safety/Judgement: Decreased awareness of deficits Awareness: Intellectual   General Comments: Frequent redirection to tasks and benefits from limited environmental distractions.'      General Comments General comments (skin integrity, edema, etc.): pt family present    Exercises     Assessment/Plan    PT Assessment Patient needs continued PT services  PT Problem List Decreased activity tolerance;Decreased balance;Decreased mobility;Decreased cognition       PT Treatment Interventions Gait training;Functional mobility training;Stair training;Therapeutic activities;Therapeutic exercise;Balance training;Patient/family education    PT Goals (Current goals can be found in the Care Plan section)  Acute Rehab PT Goals Patient Stated Goal: none stated. pt daughter wants pt to live with her PT Goal Formulation: With patient/family Time For Goal Achievement: 08/21/18 Potential to Achieve Goals: Good    Frequency Min 4X/week   Barriers to discharge        Co-evaluation               AM-PAC PT "6 Clicks" Daily Activity  Outcome Measure Difficulty turning over in bed (including adjusting bedclothes, sheets and blankets)?: None Difficulty moving from lying on back to sitting on the side of the bed? : None Difficulty sitting down on and standing up from a chair with arms (e.g., wheelchair, bedside commode, etc,.)?: None Help needed moving to and from a bed to chair (including a wheelchair)?: None Help needed walking in hospital room?: A Little Help  needed climbing 3-5 steps with a railing? : A Little 6 Click Score: 22    End of Session Equipment Utilized During Treatment: Gait belt Activity Tolerance: Patient tolerated treatment well Patient left: in chair;with call bell/phone within reach;with chair alarm set;with family/visitor present Nurse Communication: Mobility status PT Visit Diagnosis: Unsteadiness on feet (R26.81);Difficulty in walking, not elsewhere classified (R26.2)    Time: 3338-3291 PT Time Calculation (min) (ACUTE ONLY): 23 min   Charges:   PT Evaluation $PT Eval Moderate Complexity: 1 Mod PT Treatments $Therapeutic Activity: 8-22 mins        Ellamae Sia, PT, DPT Acute Rehabilitation Services  Pager: [place] 08/07/2018, 11:57 AM

## 2018-08-07 NOTE — Care Management Note (Addendum)
Case Management Note  Patient Details  Name: Mathew Garcia MRN: [id number] Date of Birth: September 03, 1941  Subjective/Objective:     CVA, seizures              Action/Plan: NCM spoke to pt's dtr, Mercer Pod # [phone]. States pt is staying with her for past 2 weeks. He is originally from [facility]. Dtr states he has a cane. Will discuss with pt once he returns from test, SNF vs HH (in [place] or [place]). Offered choice for HH/list provided.   530 pm Spoke to pt and dtr, Shantel at bedside. Pt states he is going back to [facility]. Gave permission to call dtr's in [facility]. NCM spoke to pt's dtr, Basilio Cairo, and Roselyn Bering # [phone] via phone. Dtr, Langley Gauss states she has reached out to PCP Dr Tama Gander office # [phone]. Pt had HH in the past with [facility] # [phone] in [facility], [place] RN, PT, OT, SLP and aide. Will fax [place] orders to [facility] once received. Will contact PCP's office for fax number to fax dc summary. Pt has RW, shower stool, bedside commode, RW x2, and cane in [place]. Will need HHRN, PT, OT, aide, and SLP orders with [facility].  Dr Erlinda Hong, Neurologist spoke to dtrs via phone  Expected Discharge Date:  08/08/18               Expected Discharge Plan:  [place]  In-House Referral:  Clinical Social Work  Discharge planning Services  CM Consult  Post Acute Care Choice:  Home Health Choice offered to:  Adult Children  DME Arranged:  NA DME Agency:  NA  HH Arranged:  PT, OT, Speech Therapy,RN, aide [place] Agency:    Status of Service:  In process, will continue to follow  If discussed at Long Length of Stay Meetings, dates discussed:    Additional Comments:  Erenest Rasher, RN 08/07/2018, 1:44 PM

## 2018-08-08 DIAGNOSIS — N183 Chronic kidney disease, stage 3 (moderate): Secondary | ICD-10-CM

## 2018-08-08 DIAGNOSIS — I160 Hypertensive urgency: Secondary | ICD-10-CM

## 2018-08-08 LAB — RENAL FUNCTION PANEL
Albumin: 2.8 g/dL — ABNORMAL LOW (ref 3.5–5.0)
Anion gap: 8 (ref 5–15)
BUN: 18 mg/dL (ref 8–23)
CO2: 22 mmol/L (ref 22–32)
Calcium: 8.3 mg/dL — ABNORMAL LOW (ref 8.9–10.3)
Chloride: 111 mmol/L (ref 98–111)
Creatinine, Ser: 2.23 mg/dL — ABNORMAL HIGH (ref 0.61–1.24)
GFR calc Af Amer: 31 mL/min — ABNORMAL LOW (ref >60)
GFR calc non Af Amer: 27 mL/min — ABNORMAL LOW (ref >60)
Glucose, Bld: 116 mg/dL — ABNORMAL HIGH (ref 70–99)
Phosphorus: 2.3 mg/dL — ABNORMAL LOW (ref 2.5–4.6)
Potassium: 3.1 mmol/L — ABNORMAL LOW (ref 3.5–5.1)
Sodium: 141 mmol/L (ref 135–145)

## 2018-08-08 LAB — GLUCOSE, CAPILLARY
GLUCOSE-CAPILLARY: 91 mg/dL (ref 70–99)
Glucose-Capillary: 119 mg/dL — ABNORMAL HIGH (ref 70–99)
Glucose-Capillary: 175 mg/dL — ABNORMAL HIGH (ref 70–99)
Glucose-Capillary: 121 mg/dL — ABNORMAL HIGH (ref 70–99)

## 2018-08-08 LAB — MAGNESIUM: Magnesium: 1.6 mg/dL — ABNORMAL LOW (ref 1.7–2.4)

## 2018-08-08 MED ORDER — POTASSIUM CHLORIDE CRYS ER 20 MEQ PO TBCR
40.0000 meq | EXTENDED_RELEASE_TABLET | Freq: Once | ORAL | Status: AC
  Administered 2018-08-08: 40 meq via ORAL
  Filled 2018-08-08: qty 2

## 2018-08-08 MED ORDER — AMLODIPINE BESYLATE 10 MG PO TABS
10.0000 mg | ORAL_TABLET | Freq: Every day | ORAL | Status: DC
  Administered 2018-08-08 – 2018-08-09 (×2): 10 mg via ORAL
  Filled 2018-08-08 (×2): qty 1

## 2018-08-08 MED ORDER — FUROSEMIDE 80 MG PO TABS
80.0000 mg | ORAL_TABLET | Freq: Every day | ORAL | Status: DC
  Administered 2018-08-08 – 2018-08-09 (×2): 80 mg via ORAL
  Filled 2018-08-08 (×2): qty 1

## 2018-08-08 MED ORDER — MAGNESIUM SULFATE IN D5W 1-5 GM/100ML-% IV SOLN
1.0000 g | Freq: Once | INTRAVENOUS | Status: AC
  Administered 2018-08-08: 1 g via INTRAVENOUS
  Filled 2018-08-08: qty 100

## 2018-08-08 NOTE — Progress Notes (Addendum)
SLP Cancellation Note  Patient Details Name: Mathew Garcia MRN: [id number] DOB: 01/15/1941   Cancelled treatment:       Reason Eval/Treat Not Completed: Patient at procedure or test/unavailable. Will continue efforts to provide language treatment for receptive and expressive aphasia.  Tempest Frankland B. [facility], CCC-SLP Speech Language Pathologist [phone]  Shonna Chock 08/08/2018, 12:56 PM

## 2018-08-08 NOTE — Progress Notes (Signed)
Pt's Daughter Oris Drone  In [place] ) called and stated that she did not want Mr. Malmstrom to be discharged home with Mercer Pod. Oris Drone states that mr. Gravatt lives with her in [place] and he needs to return home to [facility] and not [employer]. Oris Drone states that only she and [facility]) should pick up the pt once discharged. RN informed Langley Gauss that we would need copies of POA papers in pt's charts. Pt's doctor faxed over the information ; but the paper work are not for a legal POA. Case management notified of the events.

## 2018-08-08 NOTE — Progress Notes (Signed)
RN has changed pt twice and has noticed that pt has spots of blood leaking from his Penis; pt does not report any pain urinating or tenderness. RN assessed area around his penis checking for any small lacerations; and did not find any. RN assessed again and notice the blood was dripping from the Pt's penis. RN also took the pt to the bathroom and noticed that small drops of blood were in the toilet seat. MD notified and ordered a bladder scan to see if pt was retaining urine. Bladder scan volume revealed 250 ML( @1700 ). Will continue to monitor.

## 2018-08-08 NOTE — Progress Notes (Signed)
PROGRESS NOTE    Mathew Garcia  WUJ:[id number] DOB: [birth year] DOA: 08/06/2018 PCP: Patient, No Pcp Per  Outpatient Specialists:     Brief Narrative: Mathew Garcia is a 77 y.o. male with medical history significant of HTN, HLD, DM type II, prostate cancer.  Patient presented after being found acutely altered.  History was obtained from the patient's daughter who was present at bedside.  He has been here visiting from [facility] where his primary care provider is Dr. Nyoka Cowden.  Patient reportedly last seen normal around 1 PM and after waking up sometime around 3 PM was reportedly altered.  However, daughter gone to pick up children and then came back around 520 who found him tremulous and staring off into space.  There was reports of urinary incontinence, but no seizure activity appreciated.  EMS was called as family was concerned for low blood sugars but initial CBG 93 per EMS.   08/07/2018: Work-up done, including MRI of the brain has revealed 6 mm acute right frontal infarct.  Neurology input is highly appreciated.  Neurology is directing care.  08/08/2018: Discussed extensively with the neurologist, Dr. Erlinda Hong.  Neurology team thinks that the primary problem was likely seizure activity, and a 6 mm acute right frontal infarct found on MRI of the brain was an incidental finding.  Patient's blood pressure still significantly elevated.  As per neurology team, he should be okay to start cautiously optimize the patient's blood pressure.  Add Norvasc 10 mg p.o. once daily.  Add Lasix 80 mg p.o. once daily.  Patient has significant chronic kidney disease, but I doubt acute component for now.  We will continue hydralazine at current dose, but can titrate the dose depending on blood pressure response.  Likely, the patient has underlying dementing illness as well.  Preservation is noted.  Patient finds it difficult to follow simple commands.  Patient makes referral to consult his memory deficits.   Patient has a neurologist in [place].  Depending on PT OT recommendation, disposition will be pursued once blood pressure level is acceptable.  Otherwise, no new complaints today.  Assessment & Plan:   Active Problems:   DM (diabetes mellitus) ([place])   Hyperlipidemia   Acute encephalopathy   CVA (cerebral vascular accident) ([place])   Hypertensive emergency   Bradycardia   Acute kidney injury superimposed on chronic kidney disease (HCC)   CAD (coronary artery disease)    Seizure: EEG is non-revealing. Continue Keppra 750 mg p.o. twice daily. Continue IV benzodiazepines as needed. Seizure precautions. Follow-up with your primary neurologist upon discharge.   Acute 6 mm right frontal infarct (acute CVA)/altered mental status:  Patient presents acutely altered with inability to speak and reported loss of urine.  Blood pressure was noted to be elevated up to 213/112.  Neurology consulted concerning for hypertensive urgency vs. Seizure vs. stroke.  CT scan initially showed no acute hemorrhage or acute abnormality.  MRI of the brain pending. - Admit to a stepdown bed - Neurochecks - Seizure precautions - Follow-up urine drug screen - Check hemoglobin A1c and lipid panel in a.m. - Add on TSH - Checking MRI of the brain - Blood pressure control/permissive hypertension based off MRI - PT/OT/Speech therapy to evaluate and treat - ASA and atorvastatin - Continue Keppra per neuro recommendations - Appreciate neurology consultative services will follow-up for further recommendations 08/07/2018: Continue acute CVA protocol.  Echo is pending.  Patient is on aspirin, Plavix and statin.  For permissive hypertension for  the next 48 to 72 hours following acute CVA. 08/08/2018: The plan is to take aspirin and Plavix for 3 weeks, and afterwards, continue just Plavix.  Patient will also need cardiac monitoring for 30 days to rule out atrial fibrillation.  Hypertensive urgency:   Initial blood  pressures noted to be elevated into the 200s.  Patient does not appear to be taking any blood pressure medications. - Restart hydralazine - Held Coreg due to bradycardia 08/07/2018: Continue with permissive hypertension. 08/08/2018: Add Norvasc 10 mg p.o. once daily.  Add Lasix 80 mg p.o. once daily.  Titrate hydralazine accordingly.  Cautiously optimize patient's blood pressure to avoid excessive drop in blood pressure that could worsen patient's kidney function.  Bradycardia:  Initial heart rates 47-49 on admission.  Blood pressures currently elevated as seen above. - Hold rate controlling medications. 08/07/2018: Continue to monitor closely. 08/08/2018: This is improving.  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  Last available creatinine is from 2014 around 1.9.  This could be patient's baseline given elevated blood pressures.  Patient was given 1 L of normal saline IV fluids in the ED. - Follow-up urinalysis - Gentle IV fluids - Recheck kidney function in a.m. 08/07/2018: Serum creatinine 2014 was 1.95.  Current serum creatinine is likely slow progression of chronic kidney disease. 08/08/2018: Stable chronic kidney disease stage IIIb  Diabetes mellitus type II:  Blood sugars noted to be around 135.  Patient reportedly on glipizide unclear patient has been taking this medicine. - Hypoglycemic protocols - Hold glipizide - CBGs q. before meals and at bedtime with sensitive SSI 08/07/2018: Continue to optimize. 08/08/2018: Blood sugar is reasonably controlled.  Blood sugar this morning was 119.  CAD:  Last noted cardiac cath in Oct 2004 showed 50% proximal RCA lesion followed by a 40% LAD lesion - Diagonal #1 had sequential 30 and 20% lesions - Circumflex was angiographically normal. - Consider need of restarting Plavix 08/08/2018: Stable  Hyperlipidemia - Follow-up lipid panel 08/08/2018: Total  cholesterol is 191, LDL of 134 and HDL of 44. Continues statin.  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  Duration of anticoagulation previously undefined looking at notes from 2014. - Will need to discuss with neurology regarding   History of prostate cancer   DVT prophylaxis: heparin Code Status: Full Family Communication: No family present at bedside Disposition Plan: To be determined Consults called: Neurology  Antimicrobials:   None   Subjective: No history from patient. Patient is not able to give significant history.  Patient tries to cover his challenges by smiling or laughing.    Objective: Vitals:   08/07/18 1916 08/07/18 2347 08/08/18 0300 08/08/18 0907  BP: (!) 191/73 (!) 188/79 (!) 189/92 (!) 211/98  Pulse: (!) 56 (!) 59 62   Resp: 18 18 18    Temp: 98.4 F (36.9 C) 98.3 F (36.8 C) 98.1 F (36.7 C)   TempSrc: Oral Oral Oral   SpO2: 99% 99% 99%   Weight:      Height:        Intake/Output Summary (Last 24 hours) at 08/08/2018 1025 Last data filed at 08/08/2018 0645 Gross per 24 hour  Intake 1380 ml  Output 520 ml  Net 860 ml   Filed Weights   08/06/18 1856 08/06/18 2349  Weight: 80.7 kg 76.7 kg    Examination:  General exam:  Appears calm and comfortable.  Not in any distress.  Awake and alert. Respiratory system: Clear to auscultation.  Cardiovascular system: S1 & S2, with a systolic murmur.  Gastrointestinal system: Abdomen is nondistended, soft and nontender. No organomegaly or masses felt. Normal bowel sounds heard. Central nervous system: Awake and alert.  No focal neurological deficits. Extremities: No leg edema.    Data Reviewed: I have personally reviewed following labs and imaging studies  CBC: Recent Labs  Lab 08/06/18 1813 08/06/18 1816 08/07/18 0322  WBC 7.9  --  7.6  NEUTROABS 3.0  --   --   HGB 12.3* 12.6* 11.4*  HCT 37.1* 37.0* 34.3*  MCV 89.6   --  87.9  PLT 244  --  027   Basic Metabolic Panel: Recent Labs  Lab 08/06/18 1813 08/06/18 1816 08/07/18 0322 08/08/18 0345  NA 142 143 142 141  K 3.6 3.5 3.2* 3.1*  CL 109 108 107 111  CO2 24  --  26 22  GLUCOSE 98 93 77 116*  BUN 21 24* 19 18  CREATININE 2.30* 2.30* 2.21* 2.23*  CALCIUM 9.0  --  8.6* 8.3*  MG  --   --   --  1.6*  PHOS  --   --   --  2.3*   GFR: Estimated Creatinine Clearance: 26.9 mL/min (A) (by C-G formula based on SCr of 2.23 mg/dL (H)). Liver Function Tests: Recent Labs  Lab 08/06/18 1813 08/08/18 0345  AST 20  --   ALT 11  --   ALKPHOS 112  --   BILITOT 0.7  --   PROT 7.4  --   ALBUMIN 3.2* 2.8*   No results for input(s): LIPASE, AMYLASE in the last 168 hours. No results for input(s): AMMONIA in the last 168 hours. Coagulation Profile: Recent Labs  Lab 08/06/18 1813  INR 1.05   Cardiac Enzymes: No results for input(s): CKTOTAL, CKMB, CKMBINDEX, TROPONINI in the last 168 hours. BNP (last 3 results) No results for input(s): PROBNP in the last 8760 hours. HbA1C: Recent Labs    08/07/18 0322  HGBA1C 5.8*   CBG: Recent Labs  Lab 08/07/18 0756 08/07/18 1153 08/07/18 1614 08/07/18 2124 08/08/18 0630  GLUCAP 98 105* 160* 108* 119*   Lipid Profile: Recent Labs    08/07/18 0322  CHOL 191  HDL 44  LDLCALC 134*  TRIG 65  CHOLHDL 4.3   Thyroid Function Tests: Recent Labs    08/06/18 2340  TSH 4.457   Anemia Panel: No results for input(s): VITAMINB12, FOLATE, FERRITIN, TIBC, IRON, RETICCTPCT in the last 72 hours. Urine analysis:    Component Value Date/Time   COLORURINE STRAW (A) 08/06/2018 2227   APPEARANCEUR CLEAR 08/06/2018 2227   LABSPEC 1.009 08/06/2018 2227   PHURINE 6.0 08/06/2018 2227   GLUCOSEU NEGATIVE 08/06/2018 2227   HGBUR NEGATIVE 08/06/2018 2227   BILIRUBINUR NEGATIVE 08/06/2018 2227   KETONESUR NEGATIVE 08/06/2018 2227   PROTEINUR 100 (A) 08/06/2018 2227   UROBILINOGEN 0.2 01/31/2013 0320   NITRITE  NEGATIVE 08/06/2018 2227   LEUKOCYTESUR NEGATIVE 08/06/2018 2227   Sepsis Labs: @LABRCNTIP (procalcitonin:4,lacticidven:4)  )No results found for this or any previous visit (from the past 240 hour(s)).       Radiology Studies: Ct Head Wo Contrast  Result Date: 08/06/2018 CLINICAL DATA:  Confusion with slurred speech. Last seen normal 5:15 p.m. EXAM: CT HEAD WITHOUT CONTRAST TECHNIQUE: Contiguous axial images were obtained from the base of the skull through the vertex without intravenous contrast. COMPARISON:  Head CT 05/02/2010 FINDINGS: Brain: There is no mass, hemorrhage or extra-axial collection. The size and configuration of the ventricles and extra-axial CSF spaces are normal. Encephalomalacia of the anterior inferior left frontal lobe multifocal calcification. Ischemic Vascular: No abnormal hyperdensity of the major intracranial arteries or dural venous sinuses. No intracranial atherosclerosis. Skull: The visualized skull base, calvarium and extracranial soft tissues are normal. Sinuses/Orbits: Diffuse nasal polypoid soft tissue thickening with complete opacification of both frontal sinuses. The orbits are normal. ASPECTS ([place] Stroke Program Early CT Score) - Ganglionic level infarction (caudate, lentiform nuclei, internal capsule, insula, M1-M3 cortex): 7 - Supraganglionic infarction (M4-M6 cortex): 3 Total score (0-10 with 10 being normal): 10 Review of the MIP images confirms the above findings IMPRESSION: 1. No acute hemorrhage.  Chronic left frontal encephalomalacia. 2. ASPECTS is 10. These results were communicated to Dr. Karena Addison Aroor at 6:28 pm on 08/06/2018 by text page via the [facility] messaging system. Electronically Signed   By: Ulyses Jarred M.D.   On: 08/06/2018 18:30   Mr Jodene Nam Head Wo Contrast  Result Date: 08/07/2018 CLINICAL DATA:  Follow-up stroke. EXAM: MRA HEAD WITHOUT CONTRAST TECHNIQUE: Angiographic images of the Circle of Willis were obtained using MRA technique without  intravenous contrast. COMPARISON:  MR brain 08/06/2018.  CT head 08/06/2018. FINDINGS: The internal carotid arteries demonstrate wide patency. Both ICA termini are patent. There is mild irregularity of the RIGHT M1 MCA. Moderate irregularity, long segment 50% stenosis LEFT M1 MCA. Dominant RIGHT A1 ACA. Diminutive LEFT A1 and A2 ACA. BILATERAL M2 MCA bifurcations are patent, but there is moderate irregularity of the M3 branches bilaterally consistent with intracranial atherosclerotic disease. Basilar artery widely patent with both vertebrals contributing, RIGHT slightly larger. Fetal origin RIGHT PCA. Severe stenosis, 75-90%, RIGHT P2 PCA. Moderate to severe disease of the LEFT PCA in its P3 segments and beyond. No saccular aneurysm. Visualized cerebellar branches show segmental irregularity consistent with intracranial atherosclerotic disease. Dominant LEFT PICA. IMPRESSION: No focal narrowing or dissection of the internal carotid arteries. 50% stenosis of the proximal LEFT MCA M1 segment; RIGHT M1 MCA irregular but without stenosis. Severe 75-90% RIGHT P2 PCA stenosis. Advanced intracranial atherosclerotic change affecting the small to medium vessels. Electronically Signed   By: Staci Righter M.D.   On: 08/07/2018 13:21   Mr Brain Wo Contrast  Result Date: 08/06/2018 CLINICAL DATA:  Altered mental status after awaking from nap at 3 p.m. Hypertensive. Follow up code stroke. History of vertebral artery dissection, prostate cancer. EXAM: MRI HEAD WITHOUT CONTRAST TECHNIQUE: Multiplanar, multiecho pulse sequences of the brain and surrounding structures were obtained without intravenous contrast. COMPARISON:  CT HEAD August 06, 2018 and hip May 02, 2010 FINDINGS: INTRACRANIAL CONTENTS: 6 mm reduced diffusion mesial RIGHT frontal lobe with low ADC values. Spurs reduced diffusion, hemosiderin staining and area of LEFT frontal encephalomalacia. Ex vacuo dilatation LEFT lateral ventricle. No hydrocephalus. Patchy  supratentorial and pontine white matter FLAIR T2 hyperintensities exclusive aforementioned abnormality. Old small LEFT basal ganglia infarct. Prominent basal ganglia perivascular spaces associated chronic small vessel ischemic changes. Smaller LEFT cerebral peduncle compatible with wallerian degeneration. No abnormal extra-axial fluid collections. VASCULAR: Normal major intracranial vascular flow voids present at skull base. SKULL AND UPPER CERVICAL SPINE: No abnormal sellar expansion. No suspicious calvarial bone marrow signal. Craniocervical junction maintained. SINUSES/ORBITS: Lobulated severe frontal and ethmoid mucosal thickening. Included ocular globes and orbital contents are non-suspicious. OTHER: None. IMPRESSION: 1. Acute 6 mm RIGHT frontal infarct. 2. Old LEFT frontal/MCA territory infarct, less  likely TBI. 3. Mild-to-moderate chronic small vessel ischemic changes and old LEFT basal ganglia small infarct. 4. Severe frontoethmoidal sinusitis and polyposis. Electronically Signed   By: Elon Alas M.D.   On: 08/06/2018 22:04   Dg Chest Port 1 View  Result Date: 08/06/2018 CLINICAL DATA:  Altered mental status EXAM: PORTABLE CHEST 1 VIEW COMPARISON:  Chest CT 02/01/2013. FINDINGS: Mild cardiomegaly. No confluent airspace opacities, effusions or edema. No acute bony abnormality. IMPRESSION: Mild cardiomegaly.  No active disease. Electronically Signed   By: Rolm Baptise M.D.   On: 08/06/2018 19:25        Scheduled Meds: . aspirin  81 mg Oral Daily  . atorvastatin  80 mg Oral q1800  . clopidogrel  75 mg Oral Daily  . heparin  5,000 Units Subcutaneous Q8H  . hydrALAZINE  100 mg Oral BID  . insulin aspart  0-5 Units Subcutaneous QHS  . insulin aspart  0-9 Units Subcutaneous TID WC  . levETIRAcetam  750 mg Oral BID   Continuous Infusions: . sodium chloride 75 mL/hr at 08/08/18 0916     LOS: 1 day    Time spent: 35 minutes    Dana Allan, MD  [facility] Pager #:  [phone] contact night coverage as above

## 2018-08-08 NOTE — Progress Notes (Signed)
Physical Therapy Treatment Patient Details Name: Mathew Garcia MRN: [id number] DOB: [birth year] Today's Date: 08/08/2018    History of Present Illness  77 y.o. M admitted (+) MRI shows acute 6 mm right frontal infarct, old left frontal/MCA territory infarct, mild to moderate chronic small vessel ischemic changes and old left basal ganglia small infarct, and severe frontoethmoidal sinusitis and polyposis PMH HTN, HLD, DM type II, prostate CA    PT Comments    Patient requires supervision/min guard for safety with OOB mobility. Pt continues to present with impaired cognition and requires cues/assistance to follow through with tasks. Continue to progress as tolerated.    Follow Up Recommendations  Supervision/Assistance - 24 hour;Home health PT     Equipment Recommendations  None recommended by PT    Recommendations for Other Services       Precautions / Restrictions Precautions Precautions: Fall Restrictions Weight Bearing Restrictions: No    Mobility  Bed Mobility Overal bed mobility: Modified Independent                Transfers Overall transfer level: Needs assistance Equipment used: None Transfers: Sit to/from Stand Sit to Stand: Min guard         General transfer comment: min guard for safety  Ambulation/Gait Ambulation/Gait assistance: Min guard;Supervision Gait Distance (Feet): 250 Feet Assistive device: None Gait Pattern/deviations: Step-through pattern;Wide base of support     General Gait Details: pt with grossly steady gait; mild instability noted with turning and reaching outside BOS   Stairs Stairs: Yes Stairs assistance: Min guard Stair Management: Two rails;Alternating pattern;Forwards Number of Stairs: 2 General stair comments: min guard for safety   Wheelchair Mobility    Modified Rankin (Stroke Patients Only)       Balance Overall balance assessment: Mild deficits observed, not formally tested                                          Cognition Arousal/Alertness: Awake/alert Behavior During Therapy: WFL for tasks assessed/performed(laughing at inappropriate times) Overall Cognitive Status: Impaired/Different from baseline(difficulty to assess) Area of Impairment: Orientation;Attention;Memory;Following commands;Safety/judgement;Awareness                 Orientation Level: Time;Situation Current Attention Level: Sustained Memory: Decreased recall of precautions;Decreased short-term memory Following Commands: Follows one step commands inconsistently Safety/Judgement: Decreased awareness of deficits Awareness: Intellectual   General Comments: Pt able to correctly identify names of 2/2 family members present. Able to state that he is at "[facility]". Difficulty formulating responses to questions about what year it is, president's name, and counting. Laughs in response to these questions. Performs better when presented with functional self-care task.      Exercises      General Comments General comments (skin integrity, edema, etc.): pt family present      Pertinent Vitals/Pain Pain Assessment: Faces Faces Pain Scale: No hurt    Home Living                      Prior Function            PT Goals (current goals can now be found in the care plan section) Acute Rehab PT Goals Patient Stated Goal: none stated- family present waiting patient to return to [facility] home with family (A) Progress towards PT goals: Progressing toward goals    Frequency    Min 4X/week  PT Plan Current plan remains appropriate    Co-evaluation              AM-PAC PT "6 Clicks" Daily Activity  Outcome Measure  Difficulty turning over in bed (including adjusting bedclothes, sheets and blankets)?: None Difficulty moving from lying on back to sitting on the side of the bed? : None Difficulty sitting down on and standing up from a chair with arms (e.g., wheelchair, bedside commode,  etc,.)?: None Help needed moving to and from a bed to chair (including a wheelchair)?: None Help needed walking in hospital room?: A Little Help needed climbing 3-5 steps with a railing? : A Little 6 Click Score: 22    End of Session Equipment Utilized During Treatment: Gait belt Activity Tolerance: Patient tolerated treatment well Patient left: in chair;with call bell/phone within reach;with chair alarm set Nurse Communication: Mobility status PT Visit Diagnosis: Unsteadiness on feet (R26.81);Difficulty in walking, not elsewhere classified (R26.2)     Time: 5909-3112 PT Time Calculation (min) (ACUTE ONLY): 27 min  Charges:  $Gait Training: 8-22 mins $Therapeutic Activity: 8-22 mins                     Earney Navy, PTA Pager: [phone]     Darliss Cheney 08/08/2018, 1:49 PM

## 2018-08-08 NOTE — Progress Notes (Signed)
Occupational Therapy Treatment Patient Details Name: Mathew Garcia MRN: [id number] DOB: 1941-05-25 Today's Date: 08/08/2018    History of present illness  77 y.o. M admitted (+) MRI shows acute 6 mm right frontal infarct, old left frontal/MCA territory infarct, mild to moderate chronic small vessel ischemic changes and old left basal ganglia small infarct, and severe frontoethmoidal sinusitis and polyposis PMH HTN, HLD, DM type II, prostate CA   OT comments  Pt progressing towards acute OT goals. Focus of session was grooming tasks at sink and educating family on precautions and strategies for safe completion of ADLs. With items gathered and in place, pt able to complete grooming tasks with min guard A for safety. Discussed with family member the need to remove car keys from pt's access at home. Pt is unsafe to drive. Also discussed that pt must have 24/7 supervision at d/c. Also discussed need for family to setup and oversee medication management at home. As long as 24/7 supervision can be arranged, d/c plan to home remains appropriate.     Follow Up Recommendations  Home health OT;Supervision/Assistance - 24 hour    Equipment Recommendations  None recommended by OT    Recommendations for Other Services      Precautions / Restrictions Precautions Precautions: Fall Restrictions Weight Bearing Restrictions: No       Mobility Bed Mobility Overal bed mobility: Modified Independent                Transfers Overall transfer level: Needs assistance Equipment used: None Transfers: Sit to/from Stand Sit to Stand: Min guard         General transfer comment: from EOB. min guard for safety    Balance Overall balance assessment: Mild deficits observed, not formally tested                                         ADL either performed or assessed with clinical judgement   ADL Overall ADL's : Needs assistance/impaired     Grooming: Min guard;Oral  care;Standing;Wash/dry face Grooming Details (indicate cue type and reason): automatically reached for oral care items that had been placed on left side of sink with pt verbalizing to self "brush teeth". Pt then completed oral care including denture management with no physical assist provided. Easily opened toothbrush packaging and applied toothpaste.                              Functional mobility during ADLs: Min guard General ADL Comments: Pt completed grooming tasks as detailed above. Family present.     Vision   Additional Comments: difficult to formally assess but pt able to locate items when asked to point    Perception     Praxis      Cognition Arousal/Alertness: Awake/alert Behavior During Therapy: [facility] for tasks assessed/performed(laughing at inappropriate times) Overall Cognitive Status: Impaired/Different from baseline Area of Impairment: Orientation;Attention;Memory;Following commands;Safety/judgement;Awareness                 Orientation Level: Time;Situation Current Attention Level: Sustained Memory: Decreased recall of precautions;Decreased short-term memory Following Commands: Follows one step commands inconsistently Safety/Judgement: Decreased awareness of deficits Awareness: Intellectual   General Comments: Pt able to correctly identify names of 2/2 family members present. Able to state that he is at "[facility]". Difficulty formulating responses to questions about what year  it is, president's name, and counting. Laughs in response to these questions. Performs better when presented with functional self-care task.        Exercises     Shoulder Instructions       General Comments pt family present    Pertinent Vitals/ Pain       Pain Assessment: Faces Faces Pain Scale: No hurt  Home Living                                          Prior Functioning/Environment              Frequency  Min 2X/week         Progress Toward Goals  OT Goals(current goals can now be found in the care plan section)  Progress towards OT goals: Progressing toward goals  Acute Rehab OT Goals Patient Stated Goal: none stated- family present waiting patient to return to [facility] home with family (A) OT Goal Formulation: Patient unable to participate in goal setting Time For Goal Achievement: 08/21/18 Potential to Achieve Goals: Good ADL Goals Pt Will Perform Grooming: with supervision;standing Pt Will Perform Lower Body Bathing: with supervision;sit to/from stand Pt Will Transfer to Toilet: with supervision;ambulating Additional ADL Goal #1: pt will complete 3 step command supervision level  Plan Discharge plan remains appropriate    Co-evaluation                 AM-PAC PT "6 Clicks" Daily Activity     Outcome Measure   Help from another person eating meals?: A Little Help from another person taking care of personal grooming?: A Little Help from another person toileting, which includes using toliet, bedpan, or urinal?: A Little Help from another person bathing (including washing, rinsing, drying)?: A Little Help from another person to put on and taking off regular upper body clothing?: A Little Help from another person to put on and taking off regular lower body clothing?: A Little 6 Click Score: 18    End of Session    OT Visit Diagnosis: Unsteadiness on feet (R26.81);Muscle weakness (generalized) (M62.81)   Activity Tolerance Patient tolerated treatment well   Patient Left in bed;with call bell/phone within reach;with chair alarm set;with family/visitor present   Nurse Communication          Time: 0076-2263 OT Time Calculation (min): 24 min  Charges: OT General Charges $OT Visit: 1 Visit OT Treatments $Self Care/Home Management : 23-37 mins     Hortencia Pilar 08/08/2018, 10:56 AM

## 2018-08-08 NOTE — Progress Notes (Addendum)
STROKE TEAM PROGRESS NOTE   INTERVAL HISTORY Daughter and grandson at the bedside. Pt lying in bed, awakened upon my arrival. no further seizures or other neuro abnormality. Tolerating keppra at this time. Plans to return to [facility] in the future.   Vitals:   08/07/18 1916 08/07/18 2347 08/08/18 0300 08/08/18 0907  BP: (!) 191/73 (!) 188/79 (!) 189/92 (!) 211/98  Pulse: (!) 56 (!) 59 62   Resp: 18 18 18    Temp: 98.4 F (36.9 C) 98.3 F (36.8 C) 98.1 F (36.7 C)   TempSrc: Oral Oral Oral   SpO2: 99% 99% 99%   Weight:      Height:        CBC:  Recent Labs  Lab 08/06/18 1813 08/06/18 1816 08/07/18 0322  WBC 7.9  --  7.6  NEUTROABS 3.0  --   --   HGB 12.3* 12.6* 11.4*  HCT 37.1* 37.0* 34.3*  MCV 89.6  --  87.9  PLT 244  --  338    Basic Metabolic Panel:  Recent Labs  Lab 08/07/18 0322 08/08/18 0345  NA 142 141  K 3.2* 3.1*  CL 107 111  CO2 26 22  GLUCOSE 77 116*  BUN 19 18  CREATININE 2.21* 2.23*  CALCIUM 8.6* 8.3*  MG  --  1.6*  PHOS  --  2.3*   Lipid Panel:     Component Value Date/Time   CHOL 191 08/07/2018 0322   TRIG 65 08/07/2018 0322   HDL 44 08/07/2018 0322   CHOLHDL 4.3 08/07/2018 0322   VLDL 13 08/07/2018 0322   LDLCALC 134 (H) 08/07/2018 0322   HgbA1c:  Lab Results  Component Value Date   HGBA1C 5.8 (H) 08/07/2018   Urine Drug Screen:     Component Value Date/Time   LABOPIA NONE DETECTED 08/06/2018 2227   COCAINSCRNUR NONE DETECTED 08/06/2018 2227   LABBENZ NONE DETECTED 08/06/2018 2227   AMPHETMU NONE DETECTED 08/06/2018 2227   THCU NONE DETECTED 08/06/2018 2227   LABBARB NONE DETECTED 08/06/2018 2227    Alcohol Level     Component Value Date/Time   ETH <10 08/06/2018 1813    IMAGING Ct Head Wo Contrast  Result Date: 08/06/2018 CLINICAL DATA:  Confusion with slurred speech. Last seen normal 5:15 p.m. EXAM: CT HEAD WITHOUT CONTRAST TECHNIQUE: Contiguous axial images were obtained from the base of the skull through the vertex  without intravenous contrast. COMPARISON:  Head CT 05/02/2010 FINDINGS: Brain: There is no mass, hemorrhage or extra-axial collection. The size and configuration of the ventricles and extra-axial CSF spaces are normal. Encephalomalacia of the anterior inferior left frontal lobe multifocal calcification. Ischemic Vascular: No abnormal hyperdensity of the major intracranial arteries or dural venous sinuses. No intracranial atherosclerosis. Skull: The visualized skull base, calvarium and extracranial soft tissues are normal. Sinuses/Orbits: Diffuse nasal polypoid soft tissue thickening with complete opacification of both frontal sinuses. The orbits are normal. ASPECTS ([place] Stroke Program Early CT Score) - Ganglionic level infarction (caudate, lentiform nuclei, internal capsule, insula, M1-M3 cortex): 7 - Supraganglionic infarction (M4-M6 cortex): 3 Total score (0-10 with 10 being normal): 10 Review of the MIP images confirms the above findings IMPRESSION: 1. No acute hemorrhage.  Chronic left frontal encephalomalacia. 2. ASPECTS is 10. These results were communicated to Dr. Karena Addison Aroor at 6:28 pm on 08/06/2018 by text page via the [facility] messaging system. Electronically Signed   By: Ulyses Jarred M.D.   On: 08/06/2018 18:30   Mr Jodene Nam Head Wo Contrast  Result Date: 08/07/2018 CLINICAL DATA:  Follow-up stroke. EXAM: MRA HEAD WITHOUT CONTRAST TECHNIQUE: Angiographic images of the Circle of Willis were obtained using MRA technique without intravenous contrast. COMPARISON:  MR brain 08/06/2018.  CT head 08/06/2018. FINDINGS: The internal carotid arteries demonstrate wide patency. Both ICA termini are patent. There is mild irregularity of the RIGHT M1 MCA. Moderate irregularity, long segment 50% stenosis LEFT M1 MCA. Dominant RIGHT A1 ACA. Diminutive LEFT A1 and A2 ACA. BILATERAL M2 MCA bifurcations are patent, but there is moderate irregularity of the M3 branches bilaterally consistent with intracranial  atherosclerotic disease. Basilar artery widely patent with both vertebrals contributing, RIGHT slightly larger. Fetal origin RIGHT PCA. Severe stenosis, 75-90%, RIGHT P2 PCA. Moderate to severe disease of the LEFT PCA in its P3 segments and beyond. No saccular aneurysm. Visualized cerebellar branches show segmental irregularity consistent with intracranial atherosclerotic disease. Dominant LEFT PICA. IMPRESSION: No focal narrowing or dissection of the internal carotid arteries. 50% stenosis of the proximal LEFT MCA M1 segment; RIGHT M1 MCA irregular but without stenosis. Severe 75-90% RIGHT P2 PCA stenosis. Advanced intracranial atherosclerotic change affecting the small to medium vessels. Electronically Signed   By: Staci Righter M.D.   On: 08/07/2018 13:21   Mr Brain Wo Contrast  Result Date: 08/06/2018 CLINICAL DATA:  Altered mental status after awaking from nap at 3 p.m. Hypertensive. Follow up code stroke. History of vertebral artery dissection, prostate cancer. EXAM: MRI HEAD WITHOUT CONTRAST TECHNIQUE: Multiplanar, multiecho pulse sequences of the brain and surrounding structures were obtained without intravenous contrast. COMPARISON:  CT HEAD August 06, 2018 and hip May 02, 2010 FINDINGS: INTRACRANIAL CONTENTS: 6 mm reduced diffusion mesial RIGHT frontal lobe with low ADC values. Spurs reduced diffusion, hemosiderin staining and area of LEFT frontal encephalomalacia. Ex vacuo dilatation LEFT lateral ventricle. No hydrocephalus. Patchy supratentorial and pontine white matter FLAIR T2 hyperintensities exclusive aforementioned abnormality. Old small LEFT basal ganglia infarct. Prominent basal ganglia perivascular spaces associated chronic small vessel ischemic changes. Smaller LEFT cerebral peduncle compatible with wallerian degeneration. No abnormal extra-axial fluid collections. VASCULAR: Normal major intracranial vascular flow voids present at skull base. SKULL AND UPPER CERVICAL SPINE: No abnormal  sellar expansion. No suspicious calvarial bone marrow signal. Craniocervical junction maintained. SINUSES/ORBITS: Lobulated severe frontal and ethmoid mucosal thickening. Included ocular globes and orbital contents are non-suspicious. OTHER: None. IMPRESSION: 1. Acute 6 mm RIGHT frontal infarct. 2. Old LEFT frontal/MCA territory infarct, less likely TBI. 3. Mild-to-moderate chronic small vessel ischemic changes and old LEFT basal ganglia small infarct. 4. Severe frontoethmoidal sinusitis and polyposis. Electronically Signed   By: Elon Alas M.D.   On: 08/06/2018 22:04   Dg Chest Port 1 View  Result Date: 08/06/2018 CLINICAL DATA:  Altered mental status EXAM: PORTABLE CHEST 1 VIEW COMPARISON:  Chest CT 02/01/2013. FINDINGS: Mild cardiomegaly. No confluent airspace opacities, effusions or edema. No acute bony abnormality. IMPRESSION: Mild cardiomegaly.  No active disease. Electronically Signed   By: Rolm Baptise M.D.   On: 08/06/2018 19:25   2D Echocardiogram  - Left ventricle: The cavity size was normal. There was mild concentric hypertrophy. Systolic function was normal. The estimated ejection fraction was in the range of 60% to 65%. Wall motion was normal; there were no regional wall motion abnormalities. Features are consistent with a pseudonormal left ventricular filling pattern, with concomitant abnormal relaxation and increased filling pressure (grade 2 diastolic dysfunction). Doppler parameters are consistent with high ventricular filling pressure. - Ventricular septum: There was a small ventricular septal  defectin the perimembranous region. There was a left to right shunt through a ventricular septal defect. - Left atrium: The atrium was mildly dilated. - Pulmonary arteries: PA peak pressure: 48 mm Hg (S). Impressions:  -The right ventricular systolic pressure was increased consistent with moderate pulmonary hypertension.  Carotid Doppler   There is 1-39% bilateral ICA stenosis. Vertebral  artery flow is antegrade.    PHYSICAL EXAM General: Appears well-developed and well-nourished.  Psych: Affect appropriate to situation Eyes: No scleral injection HENT: No OP obstrucion Head: Normocephalic.  Cardiovascular: Normal rate and regular rhythm.  Skin: WDI  Neurological Examination Mental Status: Alert to name, does not know time or situtation. He laughs when asked a question he does not know the answer to. He knows daughter, grandson and can name objects. He can repeat sentences and can show 2 fingers. He perseverates when counting fingers Cranial Nerves: II: Visual fields grossly normal, blinks to threat bilaterally III,IV, VI: ptosis not present, extra-ocular motions intact bilaterally, pupils equal, round, reactive to light and accommodation V,VII: smile symmetric, facial light touch sensation normal bilaterally VIII: hearing normal bilaterally IX,X: uvula rises symmetrically XI: bilateral shoulder shrug XII: midline tongue extension Motor: Right :  Upper extremity   5/5                                      Left:     Upper extremity   5/5             Lower extremity   5/5                                                  Lower extremity   5/5 Tone and bulk:normal tone throughout; no atrophy noted Sensory: Pinprick and light touch intact throughout, bilaterally Plantars: Right: downgoing                                Left: downgoing Cerebellar: normal finger-to-nose, normal rapid alternating movements  Gait: did not assess    ASSESSMENT/PLAN Mr. Mathew Garcia is a 77 y.o. male with history of VA dissection, DB, HTN, HLD, heart murmur, CKD stage III presenting with sudden onset confusion, shivering, no speaking. Low suspicion for stroke.  Seizure  Likely seizure hx per info from dtr  Loaded with Keppra  Now on Keppra 750 bid  Tolerating Keppra without difficulty  EEG normal. No SZ  Stroke:  Incidental right frontal infarct secondary to small vessel  disease source - does not explain confusion  CT head no acute stroke. Chronic L frontal encephalomalacia. ASPECTS 10.     MRI  Small R frontal infarct. Old L frontal MCA infarct (new since 2011 imaging). Old L BG infarct. Small vessel disease. Severe sinusitis and polyposis.   MRA  ICAs ok. L M1 50% stenosis, R M1 irregular. Severe R P2 75-90% stenosis. Advanced intracranial atherosclerosis   Carotid Doppler  B ICA 1-39% stenosis, VAs antegrade   2D Echo  EF 60-65%. No source of embolus   LDL 134  HgbA1c 5.8  Heparin 5000 units sq tid for VTE prophylaxis  aspirin 81 mg daily and clopidogrel 75 mg daily prior to admission, now on aspirin 325 mg  daily. Continue DAPT at d/c x 3 WEEKS then plavix alone  Therapy recommendations:  HH OT, 24h supervision, no PT  Disposition:  pending  (lives w/ dtrs in [facility], staying with dtr in [place] for past 2 weeks with plans to return to [facility] when able)  Follow up neurologist in [facility] whom he has been following with  Follow up PCP in [facility] for resumption of care there upon return  Stroke team will sign off  Hypertensive Emergency Possible Hypertensive Encephalopathy  BP > 300 per EMS  Remains elevated but within goal of permissive hypertension (OK if < 220/120); gradually normalize in 5-7 days . Long-term BP goal normotensive  Hyperlipidemia  Home meds:  zocor 20  Now on Lipitor 80 in hospital  LDL 134, goal < 70  Continue statin at discharge  Diabetes type II  HgbA1c 5.8, goal < 7.0  Controlled  Other Stroke Risk Factors  Advanced age  Hx stroke/TIA  1 year ago in [facility] - L frontal  01/2013 - L VA dissection - presented w/ severe pain in R neck. Treated w/ coumadin. No indication for ongoing AC.  Other Active Problems  CKD stage III Cr 2.21  Anemia Hgb 11.4  Hospital day # 1  Burnetta Sabin, MSN, APRN, ANVP-BC, AGPCNP-BC Advanced Practice Stroke Nurse [facility] for Schedule & Pager information 08/08/2018  9:32 AM   To contact Stroke Continuity provider, please refer to [email or web address] After hours, contact General Neurology  ATTENDING NOTE: I reviewed above note and agree with the assessment and plan. I have made any additions or clarifications directly to the above note. Pt was seen and examined.   77 year old male with history of diabetes, hypertension, CKD, dementia, prostate cancer status post surgery admitted for episode of confusion, arm stiffness with shaking, speech difficulty and hypertension at home. He had stroke last year in [facility] and CT head showed left frontal moderate encephalomalacia with laminar necrosis.  MRI showed right frontal punctate acute infarct, with left frontal encephalomalacia and laminar necrosis.  MRA left M1 50% stenosis, right P2 75 to 90% stenosis.  EF 60 to 65%, and carotid Doppler unremarkable.  EEG no seizure.  LDL 134 and A1c 5.8.  UDS negative.  Patient presentation resembles seizure activity.  His left frontal encephalomalacia could be seizure focus, and his hypertensive emergency can also lower the seizure threshold.  He was loaded with Keppra and continued on Keppra 750 mg twice daily. Will recommend continue Keppra for seizure prevention.  He is punctate stroke likely to be incidental finding. However, given left frontal MCA infarct last year and this time right frontal stroke, cardioembolic stroke source concerned. Recommend discussed with local neurologist to consider 30-day cardio event monitoring to rule out A. fib. Continue DAPT for 3 weeks and then Plavix alone. Continue Lipitor for stroke prevention.    Neurology will sign off. Please call with questions. Patient has local neurologist [place] to follow of seizure and stroke.  Thanks for the consult.   Rosalin Hawking, MD PhD Stroke Neurology 08/08/2018 5:08 PM

## 2018-08-09 DIAGNOSIS — N189 Chronic kidney disease, unspecified: Secondary | ICD-10-CM

## 2018-08-09 DIAGNOSIS — R001 Bradycardia, unspecified: Secondary | ICD-10-CM

## 2018-08-09 DIAGNOSIS — E785 Hyperlipidemia, unspecified: Secondary | ICD-10-CM

## 2018-08-09 DIAGNOSIS — N179 Acute kidney failure, unspecified: Secondary | ICD-10-CM

## 2018-08-09 LAB — GLUCOSE, CAPILLARY
GLUCOSE-CAPILLARY: 161 mg/dL — AB (ref 70–99)
Glucose-Capillary: 200 mg/dL — ABNORMAL HIGH (ref 70–99)

## 2018-08-09 MED ORDER — HYDRALAZINE HCL 100 MG PO TABS: 100.0000 mg | ORAL_TABLET | Freq: Two times a day (BID) | ORAL | 0 refills | Status: AC

## 2018-08-09 MED ORDER — AMLODIPINE BESYLATE 10 MG PO TABS: 10.0000 mg | ORAL_TABLET | Freq: Every evening | ORAL | 0 refills | Status: AC

## 2018-08-09 MED ORDER — LEVETIRACETAM 750 MG PO TABS: 750.0000 mg | ORAL_TABLET | Freq: Two times a day (BID) | ORAL | 0 refills | Status: AC

## 2018-08-09 MED ORDER — ATORVASTATIN CALCIUM 80 MG PO TABS: 80.0000 mg | ORAL_TABLET | Freq: Every day | ORAL | 0 refills | Status: AC

## 2018-08-09 MED ORDER — POTASSIUM CHLORIDE CRYS ER 20 MEQ PO TBCR
40.0000 meq | EXTENDED_RELEASE_TABLET | Freq: Once | ORAL | Status: AC
  Administered 2018-08-09: 40 meq via ORAL
  Filled 2018-08-09: qty 2

## 2018-08-09 MED ORDER — MAGNESIUM SULFATE 2 GM/50ML IV SOLN
2.0000 g | Freq: Once | INTRAVENOUS | Status: DC
  Filled 2018-08-09: qty 50

## 2018-08-09 NOTE — Discharge Summary (Signed)
DISCHARGE SUMMARY  Mathew Garcia  MR#: [id number]  DOB:02-01-1941  Date of Admission: 08/06/2018 Date of Discharge: 08/09/2018  Attending Physician:Jeffrey Hennie Duos, MD  Patient's MEQ:ASTMHDQ, No Pcp Per  Consults:  Neurology   Disposition: D/C home with family   Follow-up Appts: Follow-up Information    Neurologist in [facility]. Schedule an appointment as soon as possible for a visit.   Why:  follow up upon return to [place] [facility] in [facility] Follow up.   Why:  See your doctor in [facility] in 5-7 days.            Tests Needing Follow-up: -assess LFTs w/ initiation of high dose Lipitor tx -f/u renal fxn -outpt referal to Urology if hematuria recurs  -f/u BP control -f/u w/ home Neurologist for seizure control and consideration for home cardiac event monitor  -discontinue ASA 3 weeks after d/c and then cont Plavix alone   Discharge Diagnoses: Seizure Altered mental status  Acute 6 mm right frontal infarct  Hypertensive urgency Bradycardia CKD stage IIIb DM2 CAD Hyperlipidemia Transient Hematuria  History of right vertebral artery dissection History of prostate cancer  Initial presentation: 77 y.o.malewith a hx ofHTN, HLD, DM type II, and prostate cancer who presented after being found acutely altered.He was last seen normal 77 y.o.malewith a hx ofHTN, HLD, DM type II, and prostate cancer who presented after being found acutely altered.He was last seen normal around 1 PM and at 5:20PM was found to be tremulous and staring off into space. EMS was called.   Hospital Course:  Seizure EEG unrevealing - continue Keppra 750 mg p.o. twice daily - cleared for d/c per Neurology   Altered mental status  Presented acutely altered with inability to speak andreported loss of urine - CThead showed no acute hemorrhage or acute abnormality - MRI brain noted a frontal CVA but this did not match his deficits and was felt to be an incidental finding - AMS was felt to be a post-ictal phenomenon, and resolved during the course of his hospital stay   Acute 6 mm right frontal infarct  Thought to  be incidental finding and not related to presenting sx - most c/w small vessel disease source - cont DAPT x3 weeks after d/c, then plavix alone - Neurology plan is as follow:  "given left frontal MCA infarct last year and this time right frontal stroke, cardioembolic stroke source (is a) concern... recommend discussing with (his) local Neurologist to consider 30-day cardio event monitoring to rule out A. Fib"  Hypertensive urgency Initial blood pressures elevated into the 222L - systolics improved into the 150s at time of d/c   Bradycardia Initial heart rates 47-49 on admission - HR stable at 80-90 at time of d/c   Likely progressive chronic kidney disease stage IIIb:  creatinine 2014 1.95 - crt stable at ~2.3 th/o this hospital stay, which is felt to likely represent his current baseline  Recent Labs  Lab 08/06/18 1813 08/06/18 1816 08/07/18 0322 08/08/18 0345  CREATININE 2.30* 2.30* 2.21* 2.23*    DM2 No changes made to his home DM regimen  CAD Last noted cardiaccath inOct 2004showed50% proximal RCA lesion followed by a 40% LAD lesion - Diagonal #1 had sequential 30 and 20% lesions - Circumflex was angiographically normal - no c/o chest pain during this hospital stay   Hyperlipidemia LDL of 134 - continue statin w/ change to lipitor at 80mg    History of right vertebral artery dissection Was tx w/ a 3-6 course of warfarin previously   History of prostate cancer  Hypokalemia Stopping lasix at d/c -  additional 36meq Kdur to be dose prior to d/c   Hypomagnesemia Stopping lasix at d/c - additional IV Mg being dosed prior to d/c    Allergies as of 08/09/2018   No Known Allergies     Medication List    STOP taking these medications   metoprolol tartrate 100 MG tablet Commonly known as:  LOPRESSOR     TAKE these medications   amLODipine 10 MG tablet Commonly known as:  NORVASC Take 1 tablet (10 mg total) by mouth every evening.   aspirin 81 MG chewable  tablet Chew 81 mg by mouth daily.   atorvastatin 80 MG tablet Commonly known as:  LIPITOR Take 1 tablet (80 mg total) by mouth daily at 6 PM.   clopidogrel 75 MG tablet Commonly known as:  PLAVIX Take 75 mg by mouth daily.   donepezil 10 MG tablet Commonly known as:  ARICEPT Take 10 mg by mouth at bedtime.   glipiZIDE 10 MG 24 hr tablet Commonly known as:  GLUCOTROL XL Take 10 mg by mouth 2 (two) times daily before a meal.   hydrALAZINE 100 MG tablet Commonly known as:  APRESOLINE Take 1 tablet (100 mg total) by mouth 2 (two) times daily.   insulin NPH-regular Human (70-30) 100 UNIT/ML injection Commonly known as:  NOVOLIN 70/30 Inject 10-20 Units into the skin daily with breakfast.   levETIRAcetam 750 MG tablet Commonly known as:  KEPPRA Take 1 tablet (750 mg total) by mouth 2 (two) times daily. What changed:    how much to take  when to take this       Day of Discharge BP (!) 156/94 (BP Location: Left Arm)   Pulse 96   Temp 97.9 F (36.6 C) (Oral)   Resp 20   Ht 5\' 5"  (1.651 m)   Wt 76.7 kg   SpO2 98%   BMI 28.14 kg/m   Physical Exam: General: No acute respiratory distress Lungs: Clear to auscultation bilaterally without wheezes or crackles Cardiovascular: Regular rate and rhythm without murmur gallop or rub normal S1 and S2 Abdomen: Nontender, nondistended, soft, bowel sounds positive, no rebound, no ascites, no appreciable mass Extremities: No significant cyanosis, clubbing, or edema bilateral lower extremities  Basic Metabolic Panel: Recent Labs  Lab 08/06/18 1813 08/06/18 1816 08/07/18 0322 08/08/18 0345  NA 142 143 142 141  K 3.6 3.5 3.2* 3.1*  CL 109 108 107 111  CO2 24  --  26 22  GLUCOSE 98 93 77 116*  BUN 21 24* 19 18  CREATININE 2.30* 2.30* 2.21* 2.23*  CALCIUM 9.0  --  8.6* 8.3*  MG  --   --   --  1.6*  PHOS  --   --   --  2.3*    Liver Function Tests: Recent Labs  Lab 08/06/18 1813 08/08/18 0345  AST 20  --   ALT 11  --    ALKPHOS 112  --   BILITOT 0.7  --   PROT 7.4  --   ALBUMIN 3.2* 2.8*    Coags: Recent Labs  Lab 08/06/18 1813  INR 1.05    CBC: Recent Labs  Lab 08/06/18 1813 08/06/18 1816 08/07/18 0322  WBC 7.9  --  7.6  NEUTROABS 3.0  --   --   HGB 12.3* 12.6* 11.4*  HCT 37.1* 37.0* 34.3*  MCV 89.6  --  87.9  PLT 244  --  224    CBG: Recent Labs  Lab 08/08/18 1200 08/08/18  1652 08/08/18 2123 08/09/18 0603 08/09/18 1120  GLUCAP 175* 121* 91 161* 200*   Time spent in discharge (includes decision making & examination of pt): 35 minutes  08/09/2018, 2:31 PM   Cherene Altes, MD [facility] Office  [phone] Pager [phone]  On-Call/Text Page:      [email or web address]      password [facility]

## 2018-08-09 NOTE — Progress Notes (Signed)
Occupational Therapy Treatment Patient Details Name: Mathew Garcia MRN: [id number] DOB: March 12, 1941 Today's Date: 08/09/2018    History of present illness  77 y.o. M admitted (+) MRI shows acute 6 mm right frontal infarct, old left frontal/MCA territory infarct, mild to moderate chronic small vessel ischemic changes and old left basal ganglia small infarct, and severe frontoethmoidal sinusitis and polyposis PMH HTN, HLD, DM type II, prostate CA   OT comments  Pt progressing towards OT goals this session. Focus was on multi-step grooming tasks with balance at sink. Pt was able to complete these with min guard. Once again, family education provided for safety and compensatory strategies for ADL. Current POC remains appropriate.  Follow Up Recommendations  Home health OT;Supervision/Assistance - 24 hour    Equipment Recommendations  None recommended by OT    Recommendations for Other Services      Precautions / Restrictions Precautions Precautions: Fall Restrictions Weight Bearing Restrictions: No       Mobility Bed Mobility Overal bed mobility: Modified Independent                Transfers Overall transfer level: Needs assistance Equipment used: None Transfers: Sit to/from Stand Sit to Stand: Min guard         General transfer comment: from EOB. min guard for safety    Balance Overall balance assessment: Mild deficits observed, not formally tested                                         ADL either performed or assessed with clinical judgement   ADL Overall ADL's : Needs assistance/impaired     Grooming: Min guard;Oral care;Standing;Wash/dry face Grooming Details (indicate cue type and reason): automatically reached for oral care items that had been placed on left side of sink. Pt then completed oral care including denture management with no physical assist provided. Easily opened toothbrush packaging and applied toothpaste.                   Toilet Transfer: Min guard           Functional mobility during ADLs: Min guard General ADL Comments: Pt completed grooming tasks as detailed above. Family present.     Vision       Perception     Praxis      Cognition Arousal/Alertness: Awake/alert Behavior During Therapy: WFL for tasks assessed/performed(laughing at inappropriate times) Overall Cognitive Status: Impaired/Different from baseline Area of Impairment: Orientation;Attention;Memory;Following commands;Safety/judgement;Awareness                 Orientation Level: Time;Situation Current Attention Level: Sustained Memory: Decreased recall of precautions;Decreased short-term memory Following Commands: Follows one step commands inconsistently Safety/Judgement: Decreased awareness of deficits Awareness: Intellectual   General Comments: Pt is able to complete familiar functional tasks without cues, pleasantly confused and cooperative throughout session        Exercises     Shoulder Instructions       General Comments      Pertinent Vitals/ Pain       Pain Assessment: No/denies pain Faces Pain Scale: No hurt  Home Living                                          Prior Functioning/Environment  Frequency  Min 2X/week        Progress Toward Goals  OT Goals(current goals can now be found in the care plan section)  Progress towards OT goals: Progressing toward goals  Acute Rehab OT Goals Patient Stated Goal: none stated- family present wanting patient to return to [facility] home with family (A) OT Goal Formulation: Patient unable to participate in goal setting Time For Goal Achievement: 08/21/18 Potential to Achieve Goals: Good ADL Goals Pt Will Perform Grooming: with supervision;standing Pt Will Perform Lower Body Bathing: with supervision;sit to/from stand Pt Will Transfer to Toilet: with supervision;ambulating Additional ADL Goal #1: pt will complete 3  step command supervision level  Plan Discharge plan remains appropriate    Co-evaluation                 AM-PAC PT "6 Clicks" Daily Activity     Outcome Measure   Help from another person eating meals?: A Little Help from another person taking care of personal grooming?: A Little Help from another person toileting, which includes using toliet, bedpan, or urinal?: A Little Help from another person bathing (including washing, rinsing, drying)?: A Little Help from another person to put on and taking off regular upper body clothing?: A Little Help from another person to put on and taking off regular lower body clothing?: A Little 6 Click Score: 18    End of Session Equipment Utilized During Treatment: Gait belt  OT Visit Diagnosis: Unsteadiness on feet (R26.81);Muscle weakness (generalized) (M62.81)   Activity Tolerance Patient tolerated treatment well   Patient Left in bed;with call bell/phone within reach;with chair alarm set;with family/visitor present   Nurse Communication Mobility status;Precautions        Time: 1914-7829 OT Time Calculation (min): 19 min  Charges: OT General Charges $OT Visit: 1 Visit OT Treatments $Self Care/Home Management : 8-22 mins  Hulda Humphrey OTR/L Acute Rehabilitation Services Pager: [phone] Office: [place] 08/09/2018, 3:48 PM

## 2018-08-09 NOTE — Care Management Important Message (Signed)
Important Message  Patient Details  Name: Mathew Garcia MRN: [id number] Date of Birth: August 13, 1941   Medicare Important Message Given:  Yes    [name] [address] 08/09/2018, 4:19 PM

## 2018-08-09 NOTE — Care Management Note (Signed)
Case Management Note  Patient Details  Name: Mathew Garcia MRN: [id number] Date of Birth: [birth year]  Subjective/Objective:                    Action/Plan: Per MD patient is ready for d/c home. CM called patients daughter, Langley Gauss and informed her. She states her brother will pick the patient up from the hospital and transport him home. Langley Gauss asking for [facility] services in [facility] through [employer]. CM faxed the [employer] orders to [facility]: [phone]. Langley Gauss is to call [facility] and provide them her address in [facility].   Expected Discharge Date:  08/08/18               Expected Discharge Plan:  [place]  In-House Referral:  Clinical Social Work  Discharge planning Services  CM Consult  Post Acute Care Choice:  Home Health Choice offered to:  Adult Children  DME Arranged:  [facility] DME Agency:     HH Arranged:  PT, OT, Speech Therapy, RN, Nurse's Aide [place] Agency:  [facility])  Status of Service:  Completed, signed off  If discussed at [place] of Stay Meetings, dates discussed:    Additional Comments:  Pollie Friar, RN 08/09/2018, 2:13 PM

## 2018-08-09 NOTE — Discharge Instructions (Signed)
Seizure, Adult A seizure is a sudden burst of abnormal electrical activity in the brain. The abnormal activity temporarily interrupts normal brain function, causing a person to experience any of the following:  Involuntary movements.  Changes in awareness or consciousness.  Uncontrollable shaking (convulsions).  Seizures usually last from 30 seconds to 2 minutes. They usually do not cause permanent brain damage unless they are prolonged. What can cause a seizure to happen? Seizures can happen for many reasons including:  A fever.  Low blood sugar.  A medicine.  An illnesses.  A brain injury.  Some people who have a seizure never have another one. People who have repeated seizures have a condition called epilepsy. What are the symptoms of a seizure? Symptoms of a seizure vary greatly from person to person. They include:  Convulsions.  Stiffening of the body.  Involuntary movements of the arms or legs.  Loss of consciousness.  Breathing problems.  Falling suddenly.  Confusion.  Head nodding.  Eye blinking or fluttering.  Lip smacking.  Drooling.  Rapid eye movements.  Grunting.  Loss of bladder control and bowel control.  Staring.  Unresponsiveness.  Some people have symptoms right before a seizure happens (aura) and right after a seizure happens. Symptoms of an aura include:  Fear or anxiety.  Nausea.  Feeling like the room is spinning (vertigo).  A feeling of having seen or heard something before (deja vu).  Odd tastes or smells.  Changes in vision, such as seeing flashing lights or spots.  Symptoms that may follow a seizure include:  Confusion.  Sleepiness.  Headache.  Weakness of one side of the body.  Follow these instructions at home: Medicines   Take over-the-counter and prescription medicines only as told by your health care provider.  Avoid any substances that may prevent your medicine from working properly, such as  alcohol. Activity  Do not drive, swim, or do any other activities that would be dangerous if you had another seizure. Wait until your health care provider approves.  If you live in the [place] check with your local DMV (department of motor vehicles) to find out about the local driving laws. Each state has specific rules about when you can legally return to driving.  Get enough rest. Lack of sleep can make seizures more likely to occur. Educating others Teach friends and family what to do if you have a seizure. They should:  Lay you on the ground to prevent a fall.  Cushion your head and body.  Loosen any tight clothing around your neck.  Turn you on your side. If vomiting occurs, this helps keep your airway clear.  Stay with you until you recover.  Not hold you down. Holding you down will not stop the seizure.  Not put anything in your mouth.  Know whether or not you need emergency care.  General instructions  Contact your health care provider each time you have a seizure.  Avoid anything that has ever triggered a seizure for you.  Keep a seizure diary. Record what you remember about each seizure, especially anything that might have triggered the seizure.  Keep all follow-up visits as told by your health care provider. This is important. Contact a health care provider if:  You have another seizure.  You have seizures more often.  Your seizure symptoms change.  You continue to have seizures with treatment.  You have symptoms of an infection or illness. They might increase your risk of having a seizure. Get help   right away if:  You have a seizure: ? That lasts longer than 5 minutes. ? That is different than previous seizures. ? That leaves you unable to speak or use a part of your body. ? That makes it harder to breathe. ? After a head injury.  You have: ? Multiple seizures in a row. ? Confusion or a severe headache right after a seizure.  You are having  seizures more often.  You do not wake up immediately after a seizure.  You injure yourself during a seizure. These symptoms may represent a serious problem that is an emergency. Do not wait to see if the symptoms will go away. Get medical help right away. Call your local emergency services (911 in the [place] Do not drive yourself to the hospital. This information is not intended to replace advice given to you by your health care provider. Make sure you discuss any questions you have with your health care provider. Document Released: 11/24/2000 Document Revised: 07/23/2016 Document Reviewed: 06/30/2016 Elsevier Interactive Patient Education  2018 Elsevier Inc.  

## 2018-08-09 NOTE — Progress Notes (Signed)
RN changed patient two times and found red blood leaking from his penis. Upon assessment RN did not find any lacerations or wounds on or around the penis. There was presence of blood in the patients urine and the patient became increasingly incontinent throughout the night. The patient does not complain of any pain. Upon the last two changes there as no blood present. RN will continue to monitor the patient. The patient rested comfortably throughout the night.

## 2018-08-09 NOTE — Progress Notes (Signed)
  Speech Language Pathology Treatment: Cognitive-Linquistic  Patient Details Name: Mathew Garcia MRN: [id number] DOB: 08-25-1941 Today's Date: 08/09/2018 Time: 1010-1035 SLP Time Calculation (min) (ACUTE ONLY): 25 min  Assessment / Plan / Recommendation Clinical Impression  Pt seen for receptive and expressive language treatment. Daughter present. Pt verbalizes "I'm doing great", and responds appropriately to social exchanges. Receptively, pt was unable to answer even simple yes/no questions accurately with any consistency. "Yes" seems to be the preferred response. Pt was unable to follow one step verbal directions, frequently responding "yes" to the command. Inconsistent accuracy with pointing to photos of common objects. Expressively, he was perseverative on "clock" during naming task, and was unable to verbalize automatic sequences.    Daughter indicates pt is feeling "moody" today, which she states is the reason for his inaccuracy. Pt does have a history of old left frontal/MCA infarct, which would be more likely to explain his language deficits. Current MRI results indicate right frontal infarct. At any rate, 24 hour supervision is recommended after acute care hospitalization.    HPI HPI: 77 yo male adm to [facility] with AMS - urinary incontinence, tremulous and staring with inability to speak.  Per imaging study, pt found to have a right frontal cva.  Pt has PMH + for prostate cancer, vertebral arter dissection, old left basal ganglia CVA.  Pt resides in [facility] and is here in [place] visiting family.        SLP Plan  Continue with current plan of care       Recommendations   24 hour supervision after DC                Follow up Recommendations: Skilled Nursing facility;24 hour supervision/assistance SLP Visit Diagnosis: Aphasia (R47.01);Cognitive communication deficit (R41.841) Plan: Continue with current plan of care       [place]. Mathew Garcia [facility], CCC-SLP Speech  Language Pathologist [phone]  Mathew Garcia 08/09/2018, 10:36 AM

## 2019-07-20 ENCOUNTER — Encounter (HOSPITAL_COMMUNITY): Payer: Self-pay

## 2019-07-20 ENCOUNTER — Emergency Department (HOSPITAL_COMMUNITY)
Admission: EM | Admit: 2019-07-20 | Discharge: 2019-07-20 | Disposition: A | Discharge status: Home / Self Care | Payer: Medicare Other | Attending: Emergency Medicine | Admitting: Emergency Medicine

## 2019-07-20 ENCOUNTER — Emergency Department (HOSPITAL_COMMUNITY): Payer: Medicare Other

## 2019-07-20 ENCOUNTER — Other Ambulatory Visit: Payer: Self-pay

## 2019-07-20 DIAGNOSIS — Z7901 Long term (current) use of anticoagulants: Secondary | ICD-10-CM | POA: Diagnosis not present

## 2019-07-20 DIAGNOSIS — R05 Cough: Secondary | ICD-10-CM | POA: Diagnosis present

## 2019-07-20 DIAGNOSIS — J302 Other seasonal allergic rhinitis: Secondary | ICD-10-CM

## 2019-07-20 DIAGNOSIS — I2510 Atherosclerotic heart disease of native coronary artery without angina pectoris: Secondary | ICD-10-CM | POA: Diagnosis not present

## 2019-07-20 DIAGNOSIS — I129 Hypertensive chronic kidney disease with stage 1 through stage 4 chronic kidney disease, or unspecified chronic kidney disease: Secondary | ICD-10-CM | POA: Insufficient documentation

## 2019-07-20 DIAGNOSIS — N189 Chronic kidney disease, unspecified: Secondary | ICD-10-CM

## 2019-07-20 DIAGNOSIS — J398 Other specified diseases of upper respiratory tract: Secondary | ICD-10-CM

## 2019-07-20 DIAGNOSIS — Z8546 Personal history of malignant neoplasm of prostate: Secondary | ICD-10-CM | POA: Insufficient documentation

## 2019-07-20 DIAGNOSIS — Z20828 Contact with and (suspected) exposure to other viral communicable diseases: Secondary | ICD-10-CM | POA: Insufficient documentation

## 2019-07-20 DIAGNOSIS — E119 Type 2 diabetes mellitus without complications: Secondary | ICD-10-CM | POA: Insufficient documentation

## 2019-07-20 DIAGNOSIS — J301 Allergic rhinitis due to pollen: Secondary | ICD-10-CM | POA: Insufficient documentation

## 2019-07-20 DIAGNOSIS — Z79899 Other long term (current) drug therapy: Secondary | ICD-10-CM | POA: Diagnosis not present

## 2019-07-20 DIAGNOSIS — R0989 Other specified symptoms and signs involving the circulatory and respiratory systems: Secondary | ICD-10-CM | POA: Insufficient documentation

## 2019-07-20 DIAGNOSIS — N183 Chronic kidney disease, stage 3 (moderate): Secondary | ICD-10-CM | POA: Insufficient documentation

## 2019-07-20 DIAGNOSIS — J988 Other specified respiratory disorders: Secondary | ICD-10-CM

## 2019-07-20 LAB — CBC WITH DIFFERENTIAL/PLATELET
Abs Immature Granulocytes: 0.01 10*3/uL (ref 0.00–0.07)
Basophils Absolute: 0 10*3/uL (ref 0.0–0.1)
Basophils Relative: 0 %
Eosinophils Absolute: 0.3 10*3/uL (ref 0.0–0.5)
Eosinophils Relative: 5 %
HCT: 33.4 % — ABNORMAL LOW (ref 39.0–52.0)
Hemoglobin: 11.5 g/dL — ABNORMAL LOW (ref 13.0–17.0)
Immature Granulocytes: 0 %
Lymphocytes Relative: 32 %
Lymphs Abs: 2.2 10*3/uL (ref 0.7–4.0)
MCH: 30.3 pg (ref 26.0–34.0)
MCHC: 34.4 g/dL (ref 30.0–36.0)
MCV: 88.1 fL (ref 80.0–100.0)
Monocytes Absolute: 0.5 10*3/uL (ref 0.1–1.0)
Monocytes Relative: 7 %
Neutro Abs: 3.8 10*3/uL (ref 1.7–7.7)
Neutrophils Relative %: 56 %
Platelets: 200 10*3/uL (ref 150–400)
RBC: 3.79 MIL/uL — ABNORMAL LOW (ref 4.22–5.81)
RDW: 13.7 % (ref 11.5–15.5)
WBC: 6.8 10*3/uL (ref 4.0–10.5)
nRBC: 0 % (ref 0.0–0.2)

## 2019-07-20 LAB — COMPREHENSIVE METABOLIC PANEL
ALT: 12 U/L (ref 0–44)
AST: 19 U/L (ref 15–41)
Albumin: 2.9 g/dL — ABNORMAL LOW (ref 3.5–5.0)
Alkaline Phosphatase: 99 U/L (ref 38–126)
Anion gap: 8 (ref 5–15)
BUN: 22 mg/dL (ref 8–23)
CO2: 24 mmol/L (ref 22–32)
Calcium: 8.7 mg/dL — ABNORMAL LOW (ref 8.9–10.3)
Chloride: 106 mmol/L (ref 98–111)
Creatinine, Ser: 3.02 mg/dL — ABNORMAL HIGH (ref 0.61–1.24)
GFR calc Af Amer: 22 mL/min — ABNORMAL LOW (ref >60)
GFR calc non Af Amer: 19 mL/min — ABNORMAL LOW (ref >60)
Glucose, Bld: 121 mg/dL — ABNORMAL HIGH (ref 70–99)
Potassium: 3.4 mmol/L — ABNORMAL LOW (ref 3.5–5.1)
Sodium: 138 mmol/L (ref 135–145)
Total Bilirubin: 0.5 mg/dL (ref 0.3–1.2)
Total Protein: 7.4 g/dL (ref 6.5–8.1)

## 2019-07-20 LAB — SARS CORONAVIRUS 2 (TAT 6-24 HRS): SARS Coronavirus 2: NEGATIVE

## 2019-07-20 MED ORDER — ALBUTEROL SULFATE HFA 108 (90 BASE) MCG/ACT IN AERS
2.0000 | INHALATION_SPRAY | RESPIRATORY_TRACT | Status: DC | PRN
  Administered 2019-07-20: 15:00:00 2 via RESPIRATORY_TRACT
  Filled 2019-07-20: qty 6.7

## 2019-07-20 MED ORDER — CETIRIZINE HCL 5 MG PO TABS: 5.0000 mg | ORAL_TABLET | Freq: Every day | ORAL | 1 refills | Status: AC

## 2019-07-20 NOTE — ED Triage Notes (Signed)
Patient complains of cough and congestion x 4 days, describes as productive. denies fever. No CP. Patient alert and oriented, NAD

## 2019-07-20 NOTE — Discharge Instructions (Signed)
You have ongoing kidney disease and need to follow with your regular doctor to make sure it is monitored.  Make sure to you are taking all of your home medications.  You were given an inhaler here today to help with the cough and you can use that every 4-6 hours as needed for coughing or congestion.  Also the allergy medicine may help with some of this.  You take that 1 time a day.

## 2019-07-20 NOTE — ED Provider Notes (Signed)
[facility] EMERGENCY DEPARTMENT Provider Note   CSN: [id number] Arrival date & time: 07/20/19  1056     History   Chief Complaint Chief Complaint  Patient presents with  . cough/ congestion    HPI Mathew Garcia is a 78 y.o. male.     Patient is a 78 year old male with a history of CVA, coronary artery disease, chronic kidney disease, hypertension and diabetes who is presenting today with his daughter for complaints of persistent cough and congestion.  Patient recently has come back to [place] after staying with a family member in [place] and reports 3 weeks of persistent symptoms.  Daughter reports that he saw Dr. down there and was prescribed Mucinex but he did not take it.  He continues to have cough and congestion.  He states that the cough sometimes hurts his chest and his abdomen but he denies change in taste, smell, appetite.  He denies any urinary symptoms, fever, nausea, vomiting or shortness of breath.  Patient states he is taking all of his medications as prescribed.  He denies headache, unilateral weakness, numbness or confusion.  The history is provided by the patient.    Past Medical History:  Diagnosis Date  . Diabetes mellitus without complication ([place])   . Heart murmur   . Hypertension   . Prostate cancer [facility])     Patient Active Problem List   Diagnosis Date Noted  . CVA (cerebral vascular accident) ([place]) 08/07/2018  . Acute kidney injury superimposed on chronic kidney disease ([place]) 08/07/2018  . CAD (coronary artery disease) 08/07/2018  . Pulmonary nodules 02/03/2013  . CKD (chronic kidney disease), stage III ([place]) 02/03/2013  . Vertebral artery dissection ([place]) 01/31/2013  . HTN (hypertension) 01/31/2013  . DM (diabetes mellitus) ([place]) 01/31/2013  . Heart murmur 01/31/2013  . Hyperlipidemia 01/31/2013    Past Surgical History:  Procedure Laterality Date  . PROSTATE SURGERY          Home Medications    Prior  to Admission medications   Medication Sig Start Date End Date Taking? Authorizing Provider  amLODipine (NORVASC) 10 MG tablet Take 1 tablet (10 mg total) by mouth every evening. 08/09/18   Cherene Altes, MD  aspirin 81 MG chewable tablet Chew 81 mg by mouth daily.    [provider]  atorvastatin (LIPITOR) 80 MG tablet Take 1 tablet (80 mg total) by mouth daily at 6 PM. 08/09/18   Cherene Altes, MD  clopidogrel (PLAVIX) 75 MG tablet Take 75 mg by mouth daily.    [provider]  donepezil (ARICEPT) 10 MG tablet Take 10 mg by mouth at bedtime.    [provider]  glipiZIDE (GLUCOTROL XL) 10 MG 24 hr tablet Take 10 mg by mouth 2 (two) times daily before a meal.    [provider]  hydrALAZINE (APRESOLINE) 100 MG tablet Take 1 tablet (100 mg total) by mouth 2 (two) times daily. 08/09/18   Cherene Altes, MD  insulin NPH-regular Human (NOVOLIN 70/30) (70-30) 100 UNIT/ML injection Inject 10-20 Units into the skin daily with breakfast.    [provider]  levETIRAcetam (KEPPRA) 750 MG tablet Take 1 tablet (750 mg total) by mouth 2 (two) times daily. 08/09/18   Cherene Altes, MD    Family History Family History  Problem Relation Age of Onset  . Diabetes Mother   . Hypertension Mother     Social History Social History   Tobacco Use  .  Smoking status: Never Smoker  . Smokeless tobacco: Never Used  Substance Use Topics  . Alcohol use: No  . Drug use: No     Allergies   Patient has no known allergies.   Review of Systems Review of Systems  All other systems reviewed and are negative.    Physical Exam Updated Vital Signs BP (!) 155/70   Pulse 61   Temp 98.7 F (37.1 C) (Oral)   Resp 16   SpO2 99%   Physical Exam Vitals signs and nursing note reviewed.  Constitutional:      General: He is not in acute distress.    Appearance: He is well-developed and normal weight.  HENT:     Head: Normocephalic and atraumatic.   Eyes:     Conjunctiva/sclera:     Right eye: Right conjunctiva is injected.     Left eye: Left conjunctiva is injected.     Pupils: Pupils are equal, round, and reactive to light.  Neck:     Musculoskeletal: Normal range of motion and neck supple.  Cardiovascular:     Rate and Rhythm: Normal rate and regular rhythm.     Heart sounds: No murmur.  Pulmonary:     Effort: Pulmonary effort is normal. No respiratory distress.     Breath sounds: Normal breath sounds. No wheezing or rales.  Abdominal:     General: There is no distension.     Palpations: Abdomen is soft.     Tenderness: There is no abdominal tenderness. There is no guarding or rebound.  Musculoskeletal: Normal range of motion.        General: No tenderness.     Right lower leg: No edema.     Left lower leg: No edema.  Skin:    General: Skin is warm and dry.     Capillary Refill: Capillary refill takes less than 2 seconds.     Findings: No erythema or rash.  Neurological:     General: No focal deficit present.     Mental Status: He is alert.     Cranial Nerves: Cranial nerves are intact.     Sensory: Sensation is intact.     Motor: Motor function is intact.     Comments: Alert and oriented x2  Psychiatric:        Mood and Affect: Mood normal.        Behavior: Behavior normal.        Thought Content: Thought content normal.      ED Treatments / Results  Labs (all labs ordered are listed, but only abnormal results are displayed) Labs Reviewed  CBC WITH DIFFERENTIAL/PLATELET - Abnormal; Notable for the following components:      Result Value   RBC 3.79 (*)    Hemoglobin 11.5 (*)    HCT 33.4 (*)    All other components within normal limits  COMPREHENSIVE METABOLIC PANEL - Abnormal; Notable for the following components:   Potassium 3.4 (*)    Glucose, Bld 121 (*)    Creatinine, Ser 3.02 (*)    Calcium 8.7 (*)    Albumin 2.9 (*)    GFR calc non Af Amer 19 (*)    GFR calc Af Amer 22 (*)    All other  components within normal limits  SARS CORONAVIRUS 2    EKG EKG Interpretation  Date/Time:  Sunday July 20 2019 12:32:44 EDT Ventricular Rate:  52 PR Interval:    QRS Duration: 113 QT Interval:  463 QTC  Calculation: 431 R Axis:   -73 Text Interpretation:  Sinus rhythm Atrial premature complex Left anterior fascicular block Anteroseptal infarct, old Abnormal T, consider ischemia, diffuse leads No significant change since last tracing Confirmed by Blanchie Dessert [phone]) on 07/20/2019 12:36:22 PM   Radiology Dg Chest 2 View  Result Date: 07/20/2019 CLINICAL DATA:  78 year old male with history of cough and chest congestion for the past 4 days. EXAM: CHEST - 2 VIEW COMPARISON:  Chest x-ray 08/06/2018. FINDINGS: Lung volumes are normal. No consolidative airspace disease. No pleural effusions. No pneumothorax. No pulmonary nodule or mass noted. Pulmonary vasculature and the cardiomediastinal silhouette are within normal limits. Aortic atherosclerosis. IMPRESSION: 1.  No radiographic evidence of acute cardiopulmonary disease. 2. Aortic atherosclerosis. Electronically Signed   By: Vinnie Langton M.D.   On: 07/20/2019 13:39    Procedures Procedures (including critical care time)  Medications Ordered in ED Medications - No data to display   Initial Impression / Assessment and Plan / ED Course  I have reviewed the triage vital signs and the nursing notes.  Pertinent labs & imaging results that were available during my care of the patient were reviewed by me and considered in my medical decision making (see chart for details).        Elderly male presenting today with persistent congestion and cough for 3 weeks.  Patient is in no acute distress on exam and denies any symptoms at this time but states he will have pain in his chest and his abdomen when he coughs.  Daughter does report that he saw Dr. last week and was told to take Mucinex but did not do it.  No known COVID contacts.   Patient vital signs are reassuring today with only mild hypertension of 155/70.  Patient is afebrile at this time and denies symptoms to suggest ACS, acute abdominal process, PE.  Will get x-ray and labs to ensure no evidence of pneumonia or worsening renal function.  EKG without acute changes.  No symptoms today to suggest new stroke.  2:36 PM Spoke with the patient's daughter and she reported that he just came here yesterday and he has been intermittently coughing.  He did see the doctor last week and they gave him an albuterol inhaler and Mucinex but he never used them.  He has had no other issues.  Will give patient an inhaler, Zyrtec as he does appear to have allergies with conjunctival injection bilaterally and congestion.  COVID swab was done.  Patient CBC today without acute findings, CMP with worsening renal function now with a creatinine of 3 from prior result almost 1 year ago of 2.5.  This may be chronic progression of disease.  No symptoms to suggest UTI or obstruction at this time.  Recommend follow-up with the PCP for further investigation and management of chronic diseases.  Final Clinical Impressions(s) / ED Diagnoses   Final diagnoses:  Congestion of upper respiratory tract  Seasonal allergies  Chronic kidney disease, unspecified CKD stage    ED Discharge Orders         Ordered    cetirizine (ZYRTEC) 5 MG tablet  Daily     07/20/19 1442           Blanchie Dessert, MD 07/20/19 1442

## 2019-07-20 NOTE — ED Notes (Signed)
Pt's family member now in room stating that the pt just recently came from [place] where he was with another family member. He was seen by a dr there and they gave him a prescription for mucinex and the pt has not taken it. Pt states that he has had cough/congestion with occasional abd pain and chest pain x 3 weeks.

## 2019-07-20 NOTE — ED Notes (Signed)
Pt verbalized understanding of d/c instructions and has no further questions, Pt hypertensive and has not taken his BP meds today. Axox4 and at baseline.

## 2020-02-03 IMAGING — DX DG CHEST 1V PORT
1 series · 1 of 1 positions shown · non-contrast
Comparison: Chest CT 02/01/2013.

CLINICAL DATA: Altered mental status

EXAM:
PORTABLE CHEST 1 VIEW

[chest]
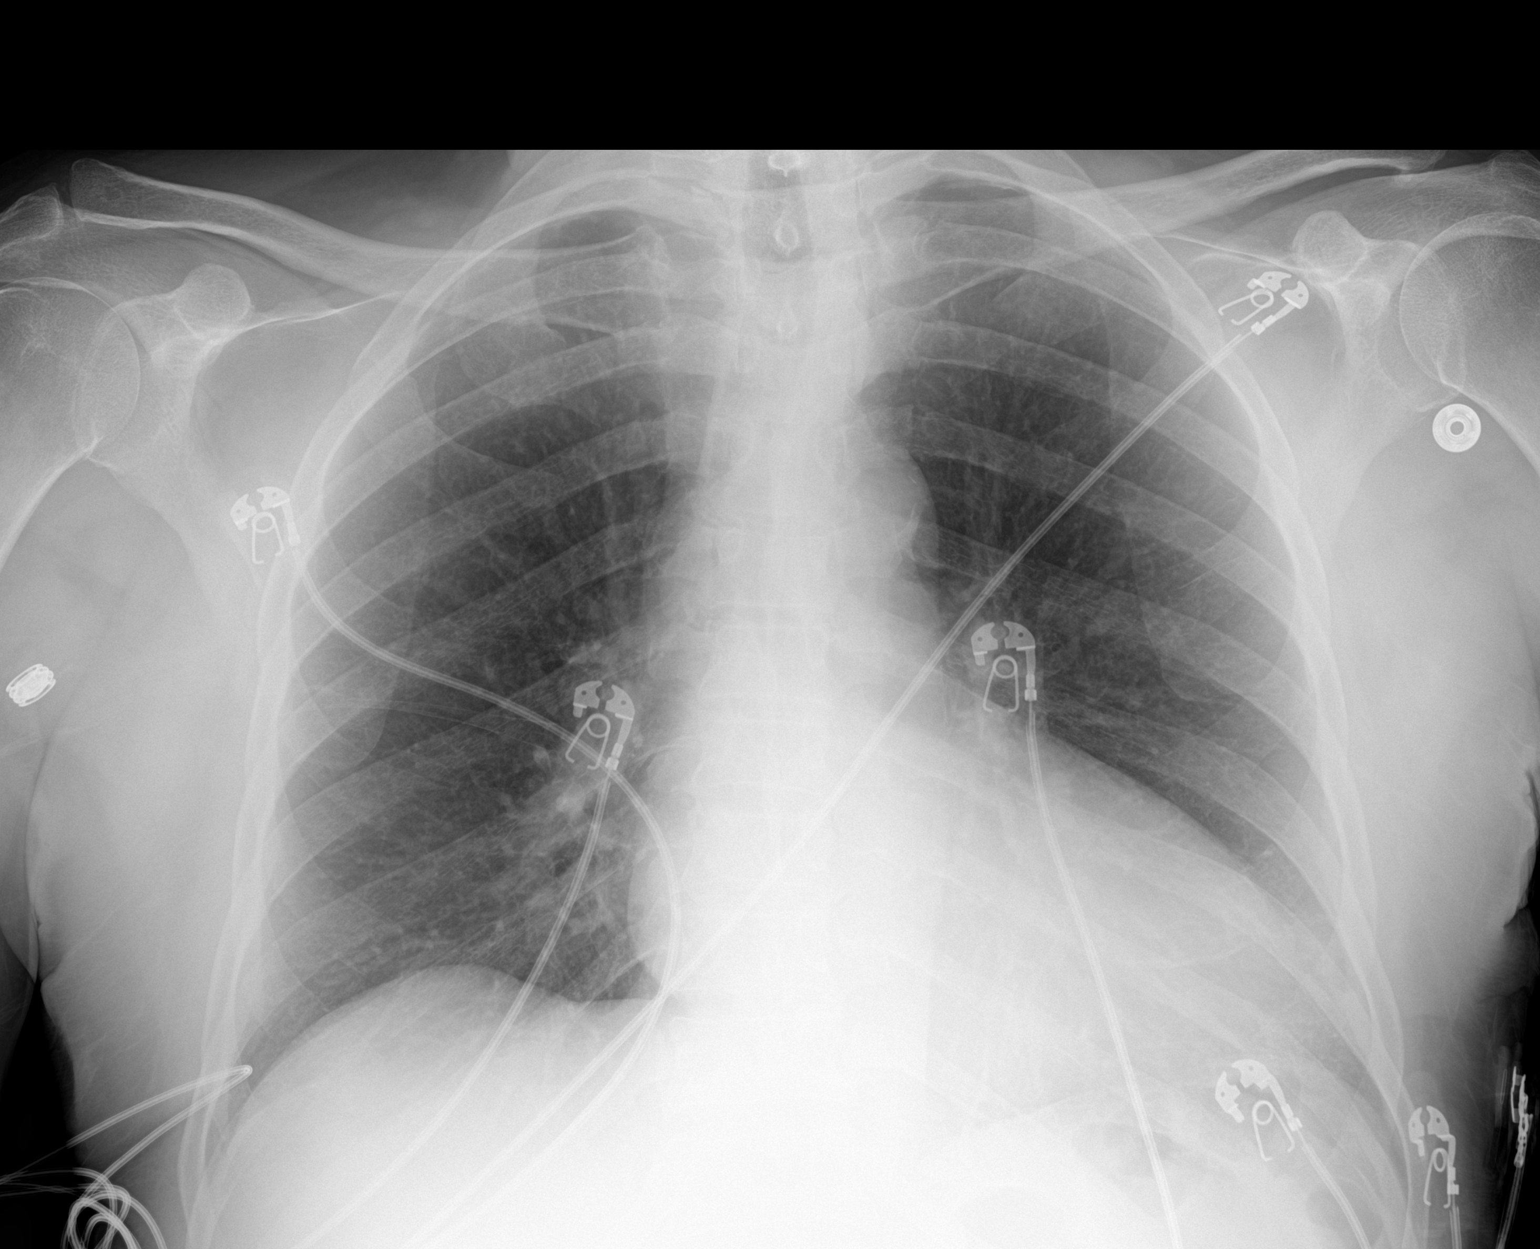

[1 of 1 positions shown; findings below may reference images not displayed]

FINDINGS: Mild cardiomegaly. No confluent airspace opacities, effusions or
edema. No acute bony abnormality.
IMPRESSION: Mild cardiomegaly.  No active disease.

## 2021-01-16 IMAGING — CR CHEST - 2 VIEW
2 series · 2 of 2 positions shown · non-contrast
Comparison: Chest x-ray 08/06/2018.

CLINICAL DATA: 78-year-old male with history of cough and chest
congestion for the past 4 days.

EXAM:
CHEST - 2 VIEW

[chest pa]
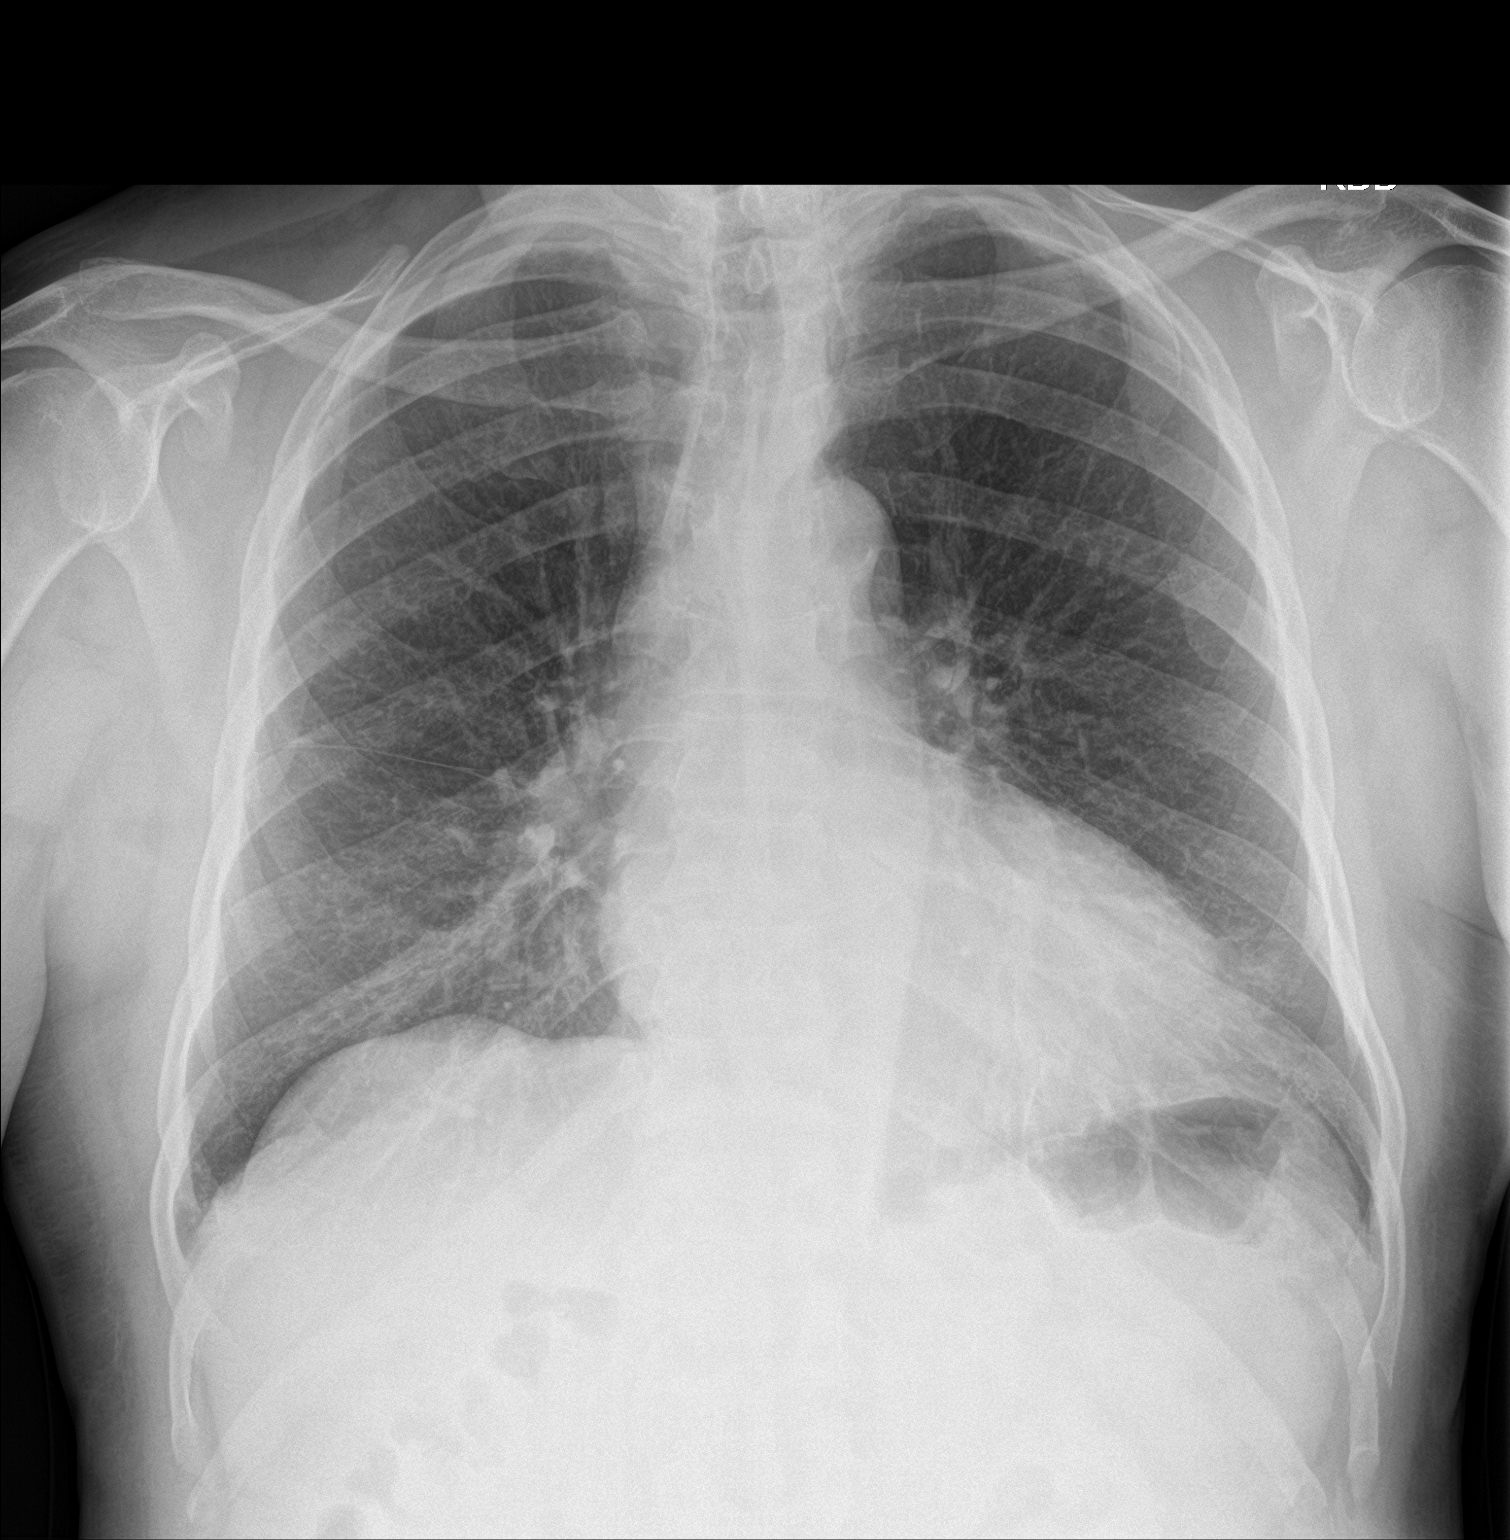

[chest lat]
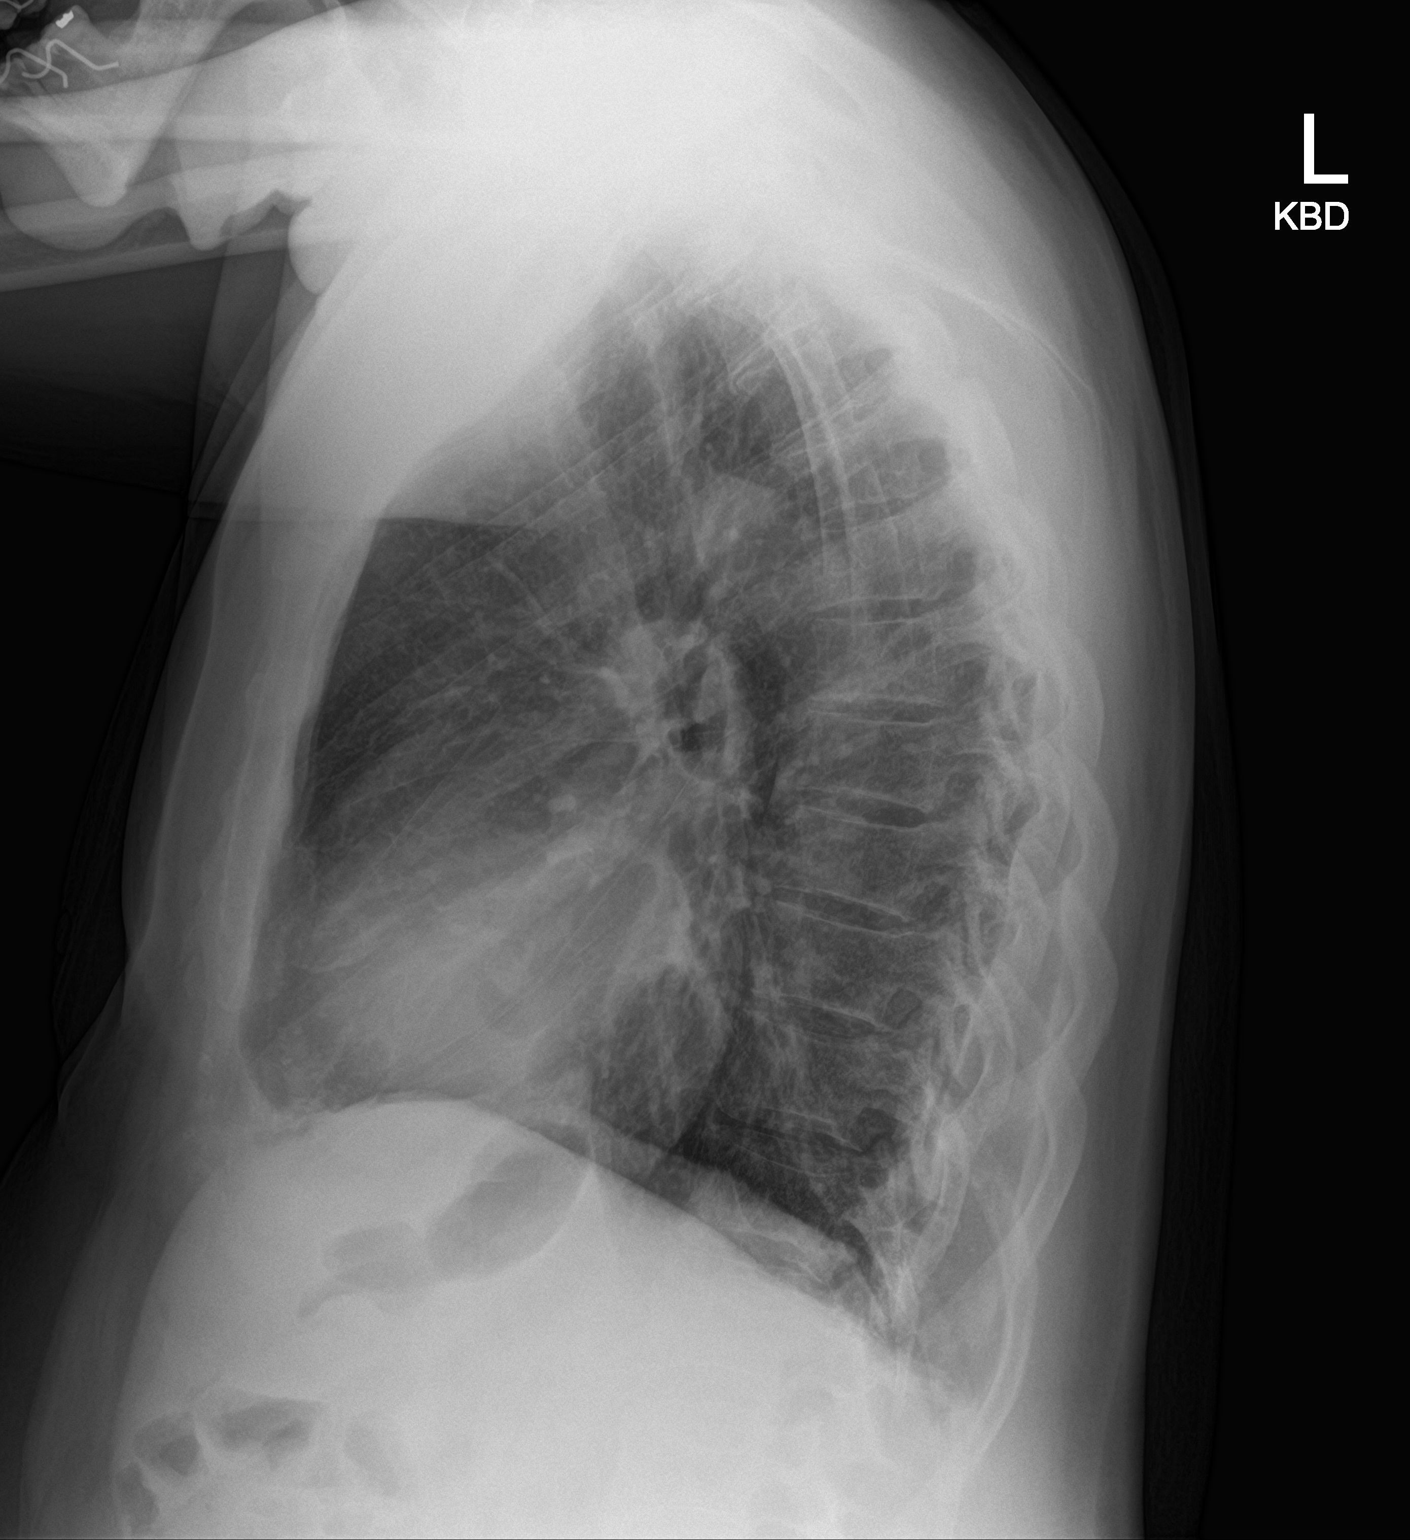

[2 of 2 positions shown; findings below may reference images not displayed]

FINDINGS: Lung volumes are normal. No consolidative airspace disease. No
pleural effusions. No pneumothorax. No pulmonary nodule or mass
noted. Pulmonary vasculature and the cardiomediastinal silhouette
are within normal limits. Aortic atherosclerosis.
IMPRESSION: 1.  No radiographic evidence of acute cardiopulmonary disease.
2. Aortic atherosclerosis.
